# Patient Record
Sex: Female | Born: 1945 | Race: Black or African American | Hispanic: No | Smoking: Former smoker
Health system: Southern US, Community
[De-identification: ages and names within clinical notes are randomized; demographics above are authoritative.]

## PROBLEM LIST (undated history)

## (undated) DIAGNOSIS — I509 Heart failure, unspecified: Secondary | ICD-10-CM

## (undated) DIAGNOSIS — J449 Chronic obstructive pulmonary disease, unspecified: Secondary | ICD-10-CM

## (undated) HISTORY — PX: TRACHEOSTOMY: SUR1362

---

## 2001-07-27 ENCOUNTER — Other Ambulatory Visit: Admission: RE | Admit: 2001-07-27 | Discharge: 2001-07-27 | Payer: Self-pay | Admitting: Internal Medicine

## 2003-02-15 ENCOUNTER — Inpatient Hospital Stay (HOSPITAL_COMMUNITY): Admission: EM | Admit: 2003-02-15 | Discharge: 2003-02-19 | Payer: Self-pay | Admitting: Emergency Medicine

## 2003-02-15 ENCOUNTER — Encounter: Payer: Self-pay | Admitting: Emergency Medicine

## 2003-07-11 ENCOUNTER — Ambulatory Visit (HOSPITAL_COMMUNITY): Admission: RE | Admit: 2003-07-11 | Discharge: 2003-07-11 | Payer: Self-pay | Admitting: Family Medicine

## 2003-07-11 ENCOUNTER — Encounter: Payer: Self-pay | Admitting: Family Medicine

## 2004-08-31 ENCOUNTER — Inpatient Hospital Stay (HOSPITAL_COMMUNITY): Admission: AD | Admit: 2004-08-31 | Discharge: 2004-09-08 | Payer: Self-pay | Admitting: Family Medicine

## 2005-10-02 ENCOUNTER — Ambulatory Visit (HOSPITAL_COMMUNITY): Admission: RE | Admit: 2005-10-02 | Discharge: 2005-10-02 | Payer: Self-pay | Admitting: Family Medicine

## 2006-10-28 ENCOUNTER — Ambulatory Visit (HOSPITAL_COMMUNITY): Admission: RE | Admit: 2006-10-28 | Discharge: 2006-10-28 | Payer: Self-pay | Admitting: Family Medicine

## 2008-04-01 ENCOUNTER — Ambulatory Visit (HOSPITAL_COMMUNITY): Admission: RE | Admit: 2008-04-01 | Discharge: 2008-04-01 | Payer: Self-pay | Admitting: Family Medicine

## 2009-04-25 ENCOUNTER — Ambulatory Visit (HOSPITAL_COMMUNITY): Admission: RE | Admit: 2009-04-25 | Discharge: 2009-04-25 | Payer: Self-pay | Admitting: Family Medicine

## 2010-06-14 ENCOUNTER — Ambulatory Visit (HOSPITAL_COMMUNITY): Admission: RE | Admit: 2010-06-14 | Discharge: 2010-06-14 | Payer: Self-pay | Admitting: Family Medicine

## 2010-06-19 ENCOUNTER — Ambulatory Visit (HOSPITAL_COMMUNITY): Admission: RE | Admit: 2010-06-19 | Discharge: 2010-06-19 | Payer: Self-pay | Admitting: Family Medicine

## 2010-12-09 ENCOUNTER — Encounter: Payer: Self-pay | Admitting: Family Medicine

## 2011-04-05 NOTE — Discharge Summary (Signed)
Brandy [name], Brandy [name]                ACCOUNT NO.:  [id number]   MEDICAL RECORD NO.:  [id number]          PATIENT TYPE:  INP   LOCATION:  A209                          FACILITY:  [facility]   PHYSICIAN:  Annia Friendly. Loleta Chance, M.D.   DATE OF BIRTH:  [birth year]   DATE OF ADMISSION:  08/31/2004  DATE OF DISCHARGE:  10/22/2005LH                                 DISCHARGE SUMMARY   The patient was a 65 year old single, gravida 2, para 2, AB 0 African-  American female from [place], [place].  Chief complaints were  general malaise, decreased appetite, runny nose, and cough.  The patient had  been experiencing these symptoms off and on past 5 days.  Cough was  described as productive (white) for 2-3 days.  The patient also experienced  shortness of breath on exertion but not at rest.  She denied nausea,  vomiting, diarrhea, chills, earache, headache, and sore throat.  The patient  took a flu shot on August 20, 2004 at the local health department.  Medical  history is positive for chronic obstructive pulmonary disease,  hyperlipidemia, and hypertension.  The patient was not allergic to any  medication.   HABITS:  Positive for former cigarette smoking.   PAST MEDICAL HISTORY:  1.  Positive for hospitalization with pregnancy.  2.  Breathing problem March 2004.   FAMILY HISTORY:  Mother living, age 21, with history of hypertension; father  deceased, cause unknown; sister living at age 36, health unknown; three  brother living age 69 with history of hypertension.  One in his 65s, health  unknown and brother in his 28s with good health; two sons living, age 22 in  good health and age 6, in good health.   PROBLEMS:  1.  Exacerbation of chronic obstructive pulmonary disease secondary to acute      bilateral pneumonia.  General appearance revealed a middle-age, small-      framed, slightly short, black female who appeared not to feel well and      short of breath.  Vitals on admission were as  follows:  Temperature      100.5, pulse 129, respirations 20, blood pressure 105/65 in the office.      Eyes were negative at discharge.  Tympanic membranes were pearly gray.      Nose negative at discharge.  Posterior pharynx was benign.  Neck was      negative for adenopathy or thyromegaly.  Lungs demonstrated poor air      movement and auscultation.  Heart revealed audible S1 and S2 without      murmur, rate of 132 and regular.  Abdomen was slightly obese with      hypoactive bowel sounds.  Abdomen was soft and nontender in all four      quadrants.  Abdominal exam demonstrated no palpable masses or      organomegaly.  Extremities demonstrated no cyanosis, no edema, and      nonpalpable right dorsalis pedis.  Left dorsalis pedis was palpable.      The patient was alert and oriented to person, place, and  time.  Cranial      nerves 2-12 appeared intact.  Significant labs on admission were as      follows:  ABGs on 2 L of O2 on August 31, 2004 at 11:58 demonstrated      the following:  pH 7.47, pCO2 37.0, pO2 48.8, pO2 83.5; ABGs on 4 L O2      1710 on August 31, 2004 were as follows:  pH 7.46, pCO2 39.8, pO2 62.0,      O2 saturation 91.2%.  Other significant labs on admission were as      follows:  White count 20,200, hemoglobin 13.0, hematocrit 38.3,      platelets 297,000, sodium 135, potassium 3.8, chloride 94, CO2 29,      glucose 137, BUN 17, creatinine 1.1, calcium 9.4, total protein 7.6,      albumin 2.9, AST 26, ALT 20, ALP 65, total bilirubin 1.3, total CPK 45,      CK-MB 1.4, troponin I 0.04.  Chest x-ray on admission was read as a      right lower lobe atelectasis/infiltrative changes with mild      atelectasis/infiltrate within the left base; 11 mm size nodule within      the right lung base.  This was increased in size, had a band-like area      of increased density superimposed on the inferior hilum which appeared      more prominent; the right hilum itself appeared enlarged  inferiorly.      Radiology recommended a chest CT and further evaluation to exclude an      underlying mass.  The patient underwent a CT of the chest on August 31, 2004, at 2004.  CT was read as 14 x 8 mm right lower lobe nodule; patchy      bilateral lobe pneumonia; lateral segment of right middle lobe      atelectasis.  Question airway obstruction; advanced changes of COPD as      read by Dr. Elly Modena.  The patient was admitted to medical floor      with telemetry, IV fluids, Xopenex 1.25 mg/Atrovent neb treatment q.6h.,      oxygen at 4 Lpm via nasal cannula, Advair Diskus 100/50, 1 inhalation      q.12h., Zithromax IV calculated by pharmacy, Solu-Medrol IV tapering      dose, antitussive medication, Levaquin 500 mg IV daily, Flumadine 100 mg      p.o. b.i.d. x 7 days, Pepcid 20 mg p.o. q.h.s., blood cultures x 2      fifteen minutes apart and other support measures.  White count went up      initially during his hospitalization.  White count reached a high of      28,500 on September 06, 2004.  Last white count before discharge had      decreased to 22,500.  The patient felt clinically better.  ABGs on room      air on September 08, 2004, demonstrated the following:  pH 7.48, pCO2      38.6, pO2 66.5, O2 saturation 93.8%.  Chest x-ray during this      hospitalization was read as follows:  On September 02, 2004:  Mildly      improved bibasilar pneumonia; stable 1.2 cm right lower lung zone nodule      and stable mild changes of COPD as read by Dr. Viviann Spare __________.  Chest      x-ray on  September 06, 2004, read as no mildly improved bibasilar      infiltrate; COPD and underlying chronic changes with stable right lower      lung nodule as read by Dr. Ulyses Southward.  The patient was alert and      oriented to person, place, and time at time of discharge.  She was      sleeping without oxygen.  She denied shortness of breath or chest     discomfort at rest or walking in room or hall.   Appetite had improved.      O2 saturation on room air on September 08, 2004, was 94%.  She revealed      some air movement on auscultation much better than compared to      admission.  She was discharged to her home on September 08, 2004.      Moreover, her heart rated had decreased significantly compared to      admission.  Heart rate on the morning of discharge was in the low mid      90s.  Respiratory culture grew to be a normal oropharyngeal flora.      Blood cultures demonstrated no growth after 5 days.  The patient was      afebrile at time of discharge.  2.  Hyperlipidemia.  The patient was continued to be treated with Lescol 80      p.o. every day.  Moreover, she was on a low cholesterol diet.  She was      not complaining of muscle ache or arm pain or leg pain during this      hospitalization.  3.  Hypertension.  Blood pressure on admission was 105/65.  Her heart exam      revealed audible S1 and S2.  Her rate in the low 130s.  Her EKG on      admission was read as sinus tachycardia, biatrial enlargement, low      voltage QRS, PR depression in inferior lead as read by Dr. Wadsworth Bing.  Chest x-ray on October 14 was read as stable and normal size      heart.  The patient required no antihypertensive medications during this      hospitalization.  Cardiac enzymes were negative on admission.  Blood      pressure on the morning of discharge was 136/83.  Blood pressure will be      monitored closely as an outpatient and acted upon accordingly.  4.  Osteopenia.  Examination of thoracic spine demonstrated degenerative      changes, and there are a few osteopenia by chest x-ray.  The patient was      started on calcium plus D 1 tablet twice a day during this      hospitalization.  5.  Chronic anemia.  Hemoglobin on admission was 13.0, hematocrit 38.1.      Hemoglobin decreased to 9.9 on September 03, 2004.  Last hemoglobin was      10.4, hematocrit 30.0 on September 08, 2004.  The  patient was discharged      home on Hemocyte Plus 1 tablet p.o. every day.  She will be given a      Hemocyte card to check for occult blood.  Family history is negative for      colon cancer.  It is felt that anemia was probably iatrogenic.   At time of discharge, diet:  Low sodium, low cholesterol.   ACTIVITY:  As tolerated.   MEDICATIONS:  1.  Advair 100/50, 1 inhalation q.12h.  2.  Histussin AC 2 tsp q.4h. p.r.n. cough.  3.  Combivent metered dose inhaler 2 puffs q.4h.  4.  Lescol XL 80 mg 1 tab daily q.h.s.  5.  Xanax 0.25 mg p.o. b.i.d. p.r.n.  6.  Os-Cal plus D 1 tab b.i.d.  7.  Hemocyte Plus 1 tab daily.  8.  Prednisone dose-pack 5 mg x 6 days.  9.  Levaquin 500 mg 1 daily.   Follow up with Dr. Loleta Chance on October 26 or 27, 2005.   FINAL PRIMARY DIAGNOSIS:  Exacerbation of chronic obstructive pulmonary  disease secondary to bilateral pneumonia.   SECONDARY DIAGNOSIS:  1.  Hypertension.  2.  Anemia, probable iatrogenic. 3.  Osteopenia.  4.  Hyperlipidemia.  5.  Right lung base nodule.  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   ADDENDUM:  The patient will be referred to Dr. Mills Koller, thoracic  surgeon for further evaluation of right lung base nodule by chest x-ray and  CT as an outpatient.      GKH/MEDQ  D:  09/16/2004  T:  09/16/2004  Job:  [id number]

## 2011-04-05 NOTE — H&P (Signed)
Brandy [name], Brandy [name]                          ACCOUNT NO.:  [id number]   MEDICAL RECORD NO.:  [id number]                   PATIENT TYPE:  EMS   LOCATION:  ED                                   FACILITY:  [facility]   PHYSICIAN:  Gracelyn Nurse, M.D.              DATE OF BIRTH:  01/09/1946   DATE OF ADMISSION:  02/15/2003  DATE OF DISCHARGE:                                HISTORY & PHYSICAL   CHIEF COMPLAINT:  Shortness of breath.   HISTORY OF PRESENT ILLNESS:  This is a 65 year old black female who presents  with a three-week history of shortness of breath.  It has become  progressively worse, much worse over the last 24 hours.  She has been doing  some wheezing.  She had a cough productive of yellow-green sputum.  She has  had no fever or chills.  She saw her primary care M.D. and was given  antibiotics and cough syrup.  She has progressively gotten worse.  She does  have a history of smoking.  She stopped 10 years ago, but is around a lot of  secondhand smoke.   PAST MEDICAL HISTORY:  Hypertension.   PAST SURGICAL HISTORY:  No past surgeries.   ALLERGIES:  No known drug allergies.   MEDICATIONS:  Lisinopril daily.   SOCIAL HISTORY:  She stopped smoking 10 years ago.  She is exposed to  secondhand smoke at her work.  She drinks no alcohol.  She works as a  Counsellor.  She is divorced and has two children.   FAMILY HISTORY:  Her mother is in her 65s and has hypertension.  Her father  died when she was young of unknown cause.   REVIEW OF SYSTEMS:  As per HPI.  Remainder of systems negative.   PHYSICAL EXAMINATION:  VITAL SIGNS:  The pulse is 20, respirations 30, blood  pressure 158/114, and O2 saturations 88% on room air.  GENERAL APPEARANCE:  This is a poorly nourished white female who is in mild  respiratory distress.  HEENT:  Pupils equal, round, and reactive to light.  Extraocular movements  intact.  Oral mucosa moist.  Oropharynx clear.  CARDIOVASCULAR:  Tachy with no  murmurs.  LUNGS:  Very poor air movement.  Some bilateral expiratory wheezing.  ABDOMEN:  Soft, nontender, and nondistended.  Bowel sounds positive.  EXTREMITIES:  No edema.  NEUROLOGIC:  Cranial nerves grossly intact.  No focal deficit.  SKIN:  Moist with no rash.   LABORATORY DATA:  The chest x-ray shows no infiltrates and question of  perihilar mass, but this is only a portable.  Admitting labs with white  blood cell count 14.8, hemoglobin 12, and platelets 280.  Sodium 134,  potassium 3.8, chloride 100, CO2 27, BUN 16, creatinine 0.8, glucose 161.  The pH was 7.17, pCO2 73, pO2 252, and bicarbonate 28 on a 100%  nonrebreather.  After nebulizer,  the pH was 7.23, pCO2 64, pO2 284, and  bicarbonate 26.   ASSESSMENT AND PLAN:  1. Chronic obstructive pulmonary disease exacerbation.  The patient does not     carry a diagnosis of this, however, with her smoking history and her     exposure to secondhand smoke and her hyperinflated lungs on the chest x-     ray, she likely has this diagnosis.  Will go ahead and treat her with     frequent nebulizers, IV steroids, O2 support, and antibiotics.  2. Hypercapnic respiratory failure.  She has improved some after a nebulizer     treatment.  Will continue the treatment as above, however, will try to     use the lowest O2 possible to maintain her O2 saturations and lessen her     pO2 retainment.  3. Bronchitis.  This is likely what triggered this.  Will go ahead and start     her on some IV antibiotics.  4. Hypertension.  Will continue her lisinopril.  5. Hyperglycemia, probably stress.  This will probably get worse with IV     steroids, who will have to keep an eye on this and use sliding scale if     necessary.                                               Gracelyn Nurse, M.D.    JDJ/MEDQ  D:  02/15/2003  T:  02/15/2003  Job:  [id number]

## 2011-04-05 NOTE — H&P (Signed)
NAMESISSY, GOETZKE                ACCOUNT NO.:  [id number]   MEDICAL RECORD NO.:  [id number]          PATIENT TYPE:  INP   LOCATION:  A209                          FACILITY:  [facility]   PHYSICIAN:  Annia Friendly. Loleta Chance, M.D.   DATE OF BIRTH:  14-Aug-1946   DATE OF ADMISSION:  08/31/2004  DATE OF DISCHARGE:  LH                                HISTORY & PHYSICAL   The patient is a 65 year old single gravida 2, para 2, AB 0, __________  black female from [place], [place].   CHIEF COMPLAINT:  General malaise, decreased appetite, runny nose, and  cough. The patient has been experiencing symptoms off and on x5 days. Cough  is described as productive (white) for 2 to 3 days. The patient experienced  shortness of breath on exertion but not at rest. The patient denies nausea,  vomiting, diarrhea, chills, earache, headache, and sore throat. The patient  took a flu shot on September 16, 2004 at the local health department.   Medical history is positive for chronic obstructive pulmonary disease,  hyperlipidemia, and hypertension. Medical history is negative for diabetes,  tuberculosis, cancer, sickle cell, and seizure disorder.   MEDICATIONS:  Prescribed medications on admission are:  1.  Advair 100/50 one inhalation every 12 hours.  2.  Combivent metered dose inhaler 2 puffs every 4 to 6 hours p.r.n.  3.  Prednisone syrup 5 mg per 5 cc p.o. every day.  4.  Lisinopril HCT 20/25 p.o. every day.   ALLERGIES:  The patient is not allergic to any known medications.   HABITS:  Positive for former cigarette smoker. Habits are negative for  ethanol and street drug use.   PAST MEDICAL HISTORY:  Positive for hospitalization for pregnancy and  breathing problems in March of 2004.   FAMILY HISTORY:  Revealed mother living at age 84 with history of  hypertension; father deceased cause unknown; 1 sister living age 37 health  unknown; 3 brothers living, age 13 with a history of hypertension, 1 in his  15s health unknown, brother in his 99s in good health; 2 sons living age 58  in good health and age 58 in good health.   REVIEW OF SYSTEMS:  Positive for chronic shortness of breath, occasional  wheezing, variable appetite.   REVIEW OF SYSTEMS:  Negative for epistaxis, bleeding gums, chest pain,  hemoptysis, hematemesis, dysuria, gross hematuria, melena, constipation,  edema of legs, swelling of face, dysphagia, night sweats, etc.   PHYSICAL EXAMINATION:  GENERAL APPEARANCE:  Revealed a middle-aged, small-  framed, slightly short black female who appeared not to feel well, short of  breath at rest.  VITAL SIGNS:  Temperature 100.5, pulse 129, respirations 20, blood pressure  105/65.  HEENT:  Head normocephalic. Ears:  Normal auricle; external canal patent.  Tympanic membranes pearly gray. Eyes:  Clear; negative for ptosis, sclerae  white; pupils are equal, round, and reactive to light; extraocular movements  intact. Nose:  Negative at discharge. Mouth:  Partial upper dentures.  Dentition:  At bottom, positive missing teeth; no oral lesions; posterior  oropharynx benign.  NECK:  Negative for lymphadenopathy or thyromegaly.  LUNGS:  Poor air movement.  HEART:  Audible S1 and S2 without murmur. Rate 132 and regular.  BREASTS:  No skin changes. Nipple regular.  ABDOMEN:  Slightly obese, hypoactive bowel sounds, soft, nontender in all 4  quadrants, no palpable masses, no organomegaly.  EXTERNAL GENITALIA:  Normal female.  RECTAL:  Deferred.  EXTREMITIES:  No edema. No joint swelling, no joint redness, no joint  hotness, no cyanosis. Palpable left dorsalis pedis, nonpalpable right  dorsalis pedis.  NEUROLOGICAL:  Alert and oriented to person, place, and time. Cranial nerves  II-XII appeared intact.   LABORATORY DATA:  ABGs on 2 liters:  PH 7.47, pCO2 37.0, pO2 48.8, O2  saturation 83.5%. Repeat ABG at 1710 on 4 liters of O2:  PH 7.46, pCO2 39.8,  pO2 62.0, O2 saturation 91.2%. White  count 20.2, hemoglobin 13.0, hematocrit  38.3, platelets 297,000. Sodium 135, potassium 3.8, chloride 94, CO2 29,  glucose 137, BUN 17, creatinine 1.1. Total bilirubin 1.3, SGOT 26, SGPT 20,  total protein 7.6, albumin 2.9, calcium 9.4. Total CPK 45, CK-MB 1.4,  troponin I 0.04. Chest x-ray was read as right lower lobe atelectasis,  slight infiltrative changes with a mild atelectasis, slight infiltrate  within the left base; 11-mm sized nodule in the right base which was  increased in size and band-like area of increased density superimposed on  inferior right hilum which appeared more prominent. Right hilum is large and  purulent, and the radiology recommended chest CT and further evaluation to  exclude underlying mass.   IMPRESSION:  1.  Chronic obstructive pulmonary disease with hypoxia.  2.  Nodule within the right lung base.   SECONDARY DIAGNOSES:  1.  History of hypertension.  2.  Hyperlipidemia.   PLAN:  Admit to telemetry. IV normal saline at 50 cc per hour. CT of chest.  Advair Diskus 100/50 one inhalation q.12h. Zithromax calculated by pharmacy.  Lescol XL 80 mg p.o. daily.  Solu-Medrol 125 mg IV q.6h. for 24 hours, then  80 mg IV q.6h. x4, then 60 mg IV q.6h. x4, then 40 mg IV q.6h. x4. Blood  cultures x2. Robitussin DM 2 teaspoons p.o. q.4h. for 72 hours, then q.6h.  Bedside commode. Repeat ABGs in a.m. Diet low cholesterol. Head of bed  greater than 35 degrees. Tylenol 500 mg p.o. q.6-8h. for headache or pain or  temperature of 101 or greater. Pepcid 20 mg p.o. at bedtime. Coumadin 100 mg  p.o. b.i.d.      GKH/MEDQ  D:  08/31/2004  T:  08/31/2004  Job:  [id number]

## 2011-04-05 NOTE — Discharge Summary (Signed)
Brandy [name], Brandy [name]                          ACCOUNT NO.:  [id number]   MEDICAL RECORD NO.:  [id number]                   PATIENT TYPE:  INP   LOCATION:  A228                                 FACILITY:  [facility]   PHYSICIAN:  Gracelyn Nurse, M.D.              DATE OF BIRTH:  10-12-1946   DATE OF ADMISSION:  02/15/2003  DATE OF DISCHARGE:  02/19/2003                                 DISCHARGE SUMMARY   DISCHARGE DIAGNOSES:  1. Chronic obstructive pulmonary disease exacerbation.  2. Hypercapnic respiratory failure.  3. Bronchitis.  4. Hypertension.  5. Steroid induced hyperglycemia.   DISCHARGE MEDICATIONS:  1. Combivent meter dosed inhaler 2 puffs q.i.d.  2. Levaquin 500 mg daily x6 days.  3. Xanax 0.25 mg t.i.d. p.r.n.  4. Lisinopril 10 mg daily.  5. Prednisone 10 mg five tablets daily x3 days, then four tablets daily, x3     days, then two tablets daily x3 days, then one tablet daily x3 days.   REASON FOR ADMISSION:  This is a 65 year old white female who presents with  a three week history of shortness of breath and has become progressively  worse, and much worse over the past 24 hours.  She has been noting some  wheezing.  She had a productive cough with yellow-green sputum.  She has not  complained of any fever or chills.  She saw her primary M.D. and was given  antibiotics and cough syrup.  She seems to have progressively gotten worse.  She does have a history of tobacco abuse, but stopped 10 years ago, however,  in the work that she does she is exposed to a lot of second hand smoke.  She  has never been diagnosed with chronic obstructive pulmonary disease before  in the past.   HOSPITAL COURSE:  #1 -  CHRONIC OBSTRUCTIVE PULMONARY DISEASE EXACERBATION:  She has not carried a diagnosis of chronic obstructive pulmonary disease,  however, with her smoking history and large exposure to second hand smoke,  plus her hyperinflated lungs on chest x-ray, more then likely she does  have  chronic obstructive pulmonary disease.  She had very poor air movement on  examination and some mild expiratory wheezing.  She was also hypercapnic and  hypoxic.  She was started on O2 support, nebulizers, and IV steroids.  She  slowly responded to treatment.  She was maintained on lowest flow O2 to keep  her saturations above 90.  By the time of discharge, she was completely off  oxygen, saturating in the low to mid 90's on room air.  She also had been  converted from nebulizers to meter dosed inhaler, and was tolerating this  well.  She understood the use of them.  She was also taken off IV steroids  and placed on a p.o. taper.  She is to follow up with her new primary care  physician.  At that  time it can be reassessed if she still needs the  bronchodilator treatment.  #2 -  BRONCHITIS:  Her chest x-ray did not show any infiltrates.  She had  some peribronchial thickening, and with her productive cough felt this was  consistent with bronchitis.  She was started on Levaquin and will finish a  10 day course as an outpatient.  #3 -  HYPERCAPNIC RESPIRATORY FAILURE:  On admission her blood gas, she had  a pH of 7.17, PCO2 of 73, and a PO2 of 252 on 100% non-rebreather.  She was  given bronchodilator therapy and her O2 was lowered.  She responded pretty  quickly.  Blood gas redrawn in a couple of hours showed a pH of 7.23, PCO2  of 64.  She was maintained at above on lowest possible O2 during hospital  stay, and was actually weaned all the way off it.  She had no mental status  changes from the hypercapnia.  We the above numbers I doubt that she is a  chronic retainer, this was probably an acute event.  We did not redraw a  blood gas here in the hospital.  I recommend when she is at her baseline  that one be drawn, but at time of discharge respiratory failure had  resolved.  #3 -  STEROID INDUCED HYPERGLYCEMIA:  Her blood sugars remained in the 150  range while here in the hospital,  but she was on high dose steroids.  Recommend this be followed up on after she is completely off the steroids  for a period of time.  #4 -  HYPERTENSION:  She was maintained on Lisinopril and blood pressure was  adequately controlled.   LABORATORY DATA:  Sodium 134, potassium 4.3, chloride 100, CO2 27, BUN 16,  creatinine 0.7, glucose 148.   DISPOSITION:  The patient is discharged in stable condition.  She is  completely off oxygen.  She is to finish the steroid taper and a course of  antibiotics.   FOLLOWUP:  She wishes to change primary care physician's, and says she has  arranged to see Dr. Elliot Cousin in followup.  So I have advised her to  call Dr. Sherrie Mustache the first of the week for a follow up appointment in  approximately one week.                                               Gracelyn Nurse, M.D.    JDJ/MEDQ  D:  02/19/2003  T:  02/21/2003  Job:  [id number]   cc:   Elliot Cousin, M.D.  [address]  [place]  [place] [address]  Fax: [phone]

## 2011-04-05 NOTE — Procedures (Signed)
NAMEJOSEFINE, FUHR                ACCOUNT NO.:  [id number]   MEDICAL RECORD NO.:  [id number]           PATIENT TYPE:   LOCATION:                                 FACILITY:   PHYSICIAN:  Winchester Bing, M.D.  DATE OF BIRTH:  08-01-1946   DATE OF PROCEDURE:  DATE OF DISCHARGE:                                EKG INTERPRETATION   1.  Sinus tachycardia.  2.  Biatrial enlargement.  3.  Low voltage QRS.  4.  PR depression in the inferior leads.     [place]   RR/MEDQ  D:  09/04/2004  T:  09/04/2004  Job:  [id number]

## 2013-10-27 ENCOUNTER — Inpatient Hospital Stay (HOSPITAL_COMMUNITY)
Admission: AD | Admit: 2013-10-27 | Discharge: 2013-11-16 | DRG: 004 | Disposition: A | Discharge status: Other Acute Inpatient Hospital | Payer: Medicare Other | Source: Other Acute Inpatient Hospital | Attending: Pulmonary Disease | Admitting: Pulmonary Disease

## 2013-10-27 ENCOUNTER — Inpatient Hospital Stay (HOSPITAL_COMMUNITY): Payer: Medicare Other

## 2013-10-27 DIAGNOSIS — I509 Heart failure, unspecified: Secondary | ICD-10-CM | POA: Diagnosis present

## 2013-10-27 DIAGNOSIS — Z9911 Dependence on respirator [ventilator] status: Secondary | ICD-10-CM

## 2013-10-27 DIAGNOSIS — R64 Cachexia: Secondary | ICD-10-CM | POA: Diagnosis present

## 2013-10-27 DIAGNOSIS — J95812 Postprocedural air leak: Secondary | ICD-10-CM | POA: Diagnosis present

## 2013-10-27 DIAGNOSIS — E43 Unspecified severe protein-calorie malnutrition: Secondary | ICD-10-CM | POA: Insufficient documentation

## 2013-10-27 DIAGNOSIS — I959 Hypotension, unspecified: Secondary | ICD-10-CM | POA: Diagnosis not present

## 2013-10-27 DIAGNOSIS — D649 Anemia, unspecified: Secondary | ICD-10-CM | POA: Diagnosis present

## 2013-10-27 DIAGNOSIS — E876 Hypokalemia: Secondary | ICD-10-CM | POA: Diagnosis present

## 2013-10-27 DIAGNOSIS — G9340 Encephalopathy, unspecified: Secondary | ICD-10-CM

## 2013-10-27 DIAGNOSIS — J9383 Other pneumothorax: Secondary | ICD-10-CM

## 2013-10-27 DIAGNOSIS — J939 Pneumothorax, unspecified: Secondary | ICD-10-CM

## 2013-10-27 DIAGNOSIS — J95811 Postprocedural pneumothorax: Secondary | ICD-10-CM | POA: Diagnosis present

## 2013-10-27 DIAGNOSIS — Z9981 Dependence on supplemental oxygen: Secondary | ICD-10-CM

## 2013-10-27 DIAGNOSIS — J9620 Acute and chronic respiratory failure, unspecified whether with hypoxia or hypercapnia: Secondary | ICD-10-CM

## 2013-10-27 DIAGNOSIS — F1010 Alcohol abuse, uncomplicated: Secondary | ICD-10-CM | POA: Diagnosis present

## 2013-10-27 DIAGNOSIS — Z87891 Personal history of nicotine dependence: Secondary | ICD-10-CM

## 2013-10-27 DIAGNOSIS — E162 Hypoglycemia, unspecified: Secondary | ICD-10-CM | POA: Diagnosis not present

## 2013-10-27 DIAGNOSIS — J441 Chronic obstructive pulmonary disease with (acute) exacerbation: Principal | ICD-10-CM

## 2013-10-27 DIAGNOSIS — J860 Pyothorax with fistula: Secondary | ICD-10-CM

## 2013-10-27 DIAGNOSIS — R1310 Dysphagia, unspecified: Secondary | ICD-10-CM | POA: Diagnosis present

## 2013-10-27 DIAGNOSIS — Z930 Tracheostomy status: Secondary | ICD-10-CM

## 2013-10-27 DIAGNOSIS — J449 Chronic obstructive pulmonary disease, unspecified: Secondary | ICD-10-CM

## 2013-10-27 DIAGNOSIS — J9600 Acute respiratory failure, unspecified whether with hypoxia or hypercapnia: Secondary | ICD-10-CM

## 2013-10-27 HISTORY — DX: Chronic obstructive pulmonary disease, unspecified: J44.9

## 2013-10-27 HISTORY — DX: Heart failure, unspecified: I50.9

## 2013-10-27 LAB — COMPREHENSIVE METABOLIC PANEL
ALT: 28 U/L (ref 0–35)
Albumin: 2.2 g/dL — ABNORMAL LOW (ref 3.5–5.2)
Alkaline Phosphatase: 59 U/L (ref 39–117)
BUN: 12 mg/dL (ref 6–23)
Calcium: 7.7 mg/dL — ABNORMAL LOW (ref 8.4–10.5)
Chloride: 98 mEq/L (ref 96–112)
GFR calc Af Amer: 60 mL/min — ABNORMAL LOW (ref >90)
GFR calc non Af Amer: 52 mL/min — ABNORMAL LOW (ref >90)
Glucose, Bld: 87 mg/dL (ref 70–99)
Potassium: 2.9 mEq/L — ABNORMAL LOW (ref 3.5–5.1)
Sodium: 136 mEq/L (ref 135–145)
Total Bilirubin: 0.7 mg/dL (ref 0.3–1.2)
Total Protein: 4.3 g/dL — ABNORMAL LOW (ref 6.0–8.3)

## 2013-10-27 LAB — POCT I-STAT 3, ART BLOOD GAS (G3+)
Acid-Base Excess: 10 mmol/L — ABNORMAL HIGH (ref 0.0–2.0)
O2 Saturation: 100 %
Patient temperature: 97.8
pCO2 arterial: 27.4 mmHg — ABNORMAL LOW (ref 35.0–45.0)
pO2, Arterial: 140 mmHg — ABNORMAL HIGH (ref 80.0–100.0)

## 2013-10-27 LAB — PROTIME-INR: INR: 1.26 (ref 0.00–1.49)

## 2013-10-27 LAB — LACTIC ACID, PLASMA: Lactic Acid, Venous: 1.3 mmol/L (ref 0.5–2.2)

## 2013-10-27 LAB — APTT: aPTT: 31 seconds (ref 24–37)

## 2013-10-27 LAB — CBC WITH DIFFERENTIAL/PLATELET
Eosinophils Relative: 1 % (ref 0–5)
HCT: 24.7 % — ABNORMAL LOW (ref 36.0–46.0)
Hemoglobin: 8.2 g/dL — ABNORMAL LOW (ref 12.0–15.0)
Lymphocytes Relative: 9 % — ABNORMAL LOW (ref 12–46)
MCH: 26.5 pg (ref 26.0–34.0)
MCV: 79.7 fL (ref 78.0–100.0)
Monocytes Absolute: 1.2 10*3/uL — ABNORMAL HIGH (ref 0.1–1.0)
Monocytes Relative: 13 % — ABNORMAL HIGH (ref 3–12)
Neutro Abs: 6.8 10*3/uL (ref 1.7–7.7)
RBC: 3.1 MIL/uL — ABNORMAL LOW (ref 3.87–5.11)
WBC: 8.8 10*3/uL (ref 4.0–10.5)

## 2013-10-27 LAB — MRSA PCR SCREENING: MRSA by PCR: NEGATIVE

## 2013-10-27 LAB — TROPONIN I: Troponin I: <0.3 ng/mL (ref <0.30)

## 2013-10-27 LAB — MAGNESIUM: Magnesium: 1.3 mg/dL — ABNORMAL LOW (ref 1.5–2.5)

## 2013-10-27 MED ORDER — SODIUM CHLORIDE 0.9 % IV SOLN
INTRAVENOUS | Status: DC
  Administered 2013-10-27 – 2013-11-01 (×6): via INTRAVENOUS

## 2013-10-27 MED ORDER — PROPOFOL 10 MG/ML IV EMUL
5.0000 ug/kg/min | INTRAVENOUS | Status: DC
  Administered 2013-10-28: 15 ug/kg/min via INTRAVENOUS
  Administered 2013-10-29: 30 ug/kg/min via INTRAVENOUS
  Administered 2013-10-29: 5 ug/kg/min via INTRAVENOUS
  Administered 2013-10-29: 40 ug/kg/min via INTRAVENOUS
  Filled 2013-10-27 (×2): qty 100

## 2013-10-27 MED ORDER — MAGNESIUM SULFATE 40 MG/ML IJ SOLN
2.0000 g | Freq: Once | INTRAMUSCULAR | Status: AC
  Administered 2013-10-27: 2 g via INTRAVENOUS
  Filled 2013-10-27: qty 50

## 2013-10-27 MED ORDER — BIOTENE DRY MOUTH MT LIQD
1.0000 "application " | Freq: Four times a day (QID) | OROMUCOSAL | Status: DC
  Administered 2013-10-27 – 2013-11-16 (×76): 15 mL via OROMUCOSAL

## 2013-10-27 MED ORDER — HEPARIN SODIUM (PORCINE) 5000 UNIT/ML IJ SOLN
5000.0000 [IU] | Freq: Three times a day (TID) | INTRAMUSCULAR | Status: DC
  Administered 2013-10-27 – 2013-11-04 (×21): 5000 [IU] via SUBCUTANEOUS
  Filled 2013-10-27 (×27): qty 1

## 2013-10-27 MED ORDER — ALBUTEROL SULFATE HFA 108 (90 BASE) MCG/ACT IN AERS
6.0000 | INHALATION_SPRAY | RESPIRATORY_TRACT | Status: DC | PRN
  Filled 2013-10-27: qty 6.7

## 2013-10-27 MED ORDER — PROPOFOL 10 MG/ML IV EMUL
INTRAVENOUS | Status: AC
  Filled 2013-10-27: qty 100

## 2013-10-27 MED ORDER — PANTOPRAZOLE SODIUM 40 MG IV SOLR
40.0000 mg | INTRAVENOUS | Status: DC
  Administered 2013-10-27: 40 mg via INTRAVENOUS
  Filled 2013-10-27 (×2): qty 40

## 2013-10-27 MED ORDER — ALBUTEROL SULFATE HFA 108 (90 BASE) MCG/ACT IN AERS
6.0000 | INHALATION_SPRAY | RESPIRATORY_TRACT | Status: DC
  Administered 2013-10-27 – 2013-10-28 (×5): 6 via RESPIRATORY_TRACT
  Filled 2013-10-27: qty 6.7

## 2013-10-27 MED ORDER — PROPOFOL 10 MG/ML IV EMUL
5.0000 ug/kg/min | INTRAVENOUS | Status: DC
  Administered 2013-10-27: 10 ug/kg/min via INTRAVENOUS

## 2013-10-27 MED ORDER — SODIUM CHLORIDE 0.9 % IV BOLUS (SEPSIS)
1000.0000 mL | Freq: Once | INTRAVENOUS | Status: AC
  Administered 2013-10-27: 1000 mL via INTRAVENOUS

## 2013-10-27 MED ORDER — CHLORHEXIDINE GLUCONATE 0.12 % MT SOLN
15.0000 mL | Freq: Two times a day (BID) | OROMUCOSAL | Status: DC
  Administered 2013-10-27: 15 mL via OROMUCOSAL
  Filled 2013-10-27: qty 15

## 2013-10-27 MED ORDER — IPRATROPIUM BROMIDE HFA 17 MCG/ACT IN AERS
6.0000 | INHALATION_SPRAY | RESPIRATORY_TRACT | Status: DC
  Administered 2013-10-27 – 2013-10-28 (×5): 6 via RESPIRATORY_TRACT
  Filled 2013-10-27: qty 12.9

## 2013-10-27 MED ORDER — POTASSIUM CHLORIDE 10 MEQ/50ML IV SOLN
10.0000 meq | INTRAVENOUS | Status: AC
  Administered 2013-10-27 (×4): 10 meq via INTRAVENOUS
  Filled 2013-10-27 (×4): qty 50

## 2013-10-27 MED ORDER — POTASSIUM PHOSPHATE DIBASIC 3 MMOLE/ML IV SOLN
15.0000 mmol | Freq: Once | INTRAVENOUS | Status: AC
  Administered 2013-10-27: 15 mmol via INTRAVENOUS
  Filled 2013-10-27: qty 5

## 2013-10-27 NOTE — H&P (Signed)
PULMONARY  / CRITICAL CARE MEDICINE  Name: Brandy [name] MRN: [id number] DOB: Nov 09, 1946    ADMISSION DATE:  10/27/2013 CONSULTATION DATE:  10/27/2013  REFERRING MD :  Maryruth Bun ICU PRIMARY SERVICE:  PCCM  CHIEF COMPLAINT:  Acute respiratory failure  BRIEF PATIENT DESCRIPTION: 67 yo with past medical history of COPD and CHF transferred from [facility] after being admitted x 2 weeks for acute respiratory failure.  Extubated 12/08.  Re intubated 12/09.  Course was complicated by pneumothorax requiring chest tube placement.  SIGNIFICANT EVENTS / STUDIES:  11/30  Admitted to [facility] with acute respiratory failure, failed BiPAP, intubated 12/08  Extubated 12/09  Re intubated 12/09  Pneumothorax / R chest tube placed  LINES / TUBES: OETT 12/09 >>> OGT 12/09 >>> Foley ??? >>> R [place] CVL 12/09 >>> R chest tube 12/09 >>>  CULTURES:  ANTIBIOTICS:  The patient is encephalopathic and unable to provide history, which was obtained for available medical records.  HISTORY OF PRESENT ILLNESS:  67 yo with past medical history of COPD and CHF transferred from [facility] after being admitted x 2 weeks for acute respiratory failure.  Extubated 12/08.  Re intubated 12/09.  Course was complicated by pneumothorax requiring chest tube placement.  PAST MEDICAL HISTORY :  No past medical history on file. No past surgical history on file. Prior to Admission medications   Not on File   Not on File  FAMILY HISTORY:  No family history on file.  SOCIAL HISTORY:  has no tobacco, alcohol, and drug history on file.  REVIEW OF SYSTEMS:  Unable to provide.  INTERVAL HISTORY:  VITAL SIGNS: FiO2 (%):  [50 %] 50 % (12/10 1600) Weight:  [42 kg (92 lb 9.5 oz)] 42 kg (92 lb 9.5 oz) (12/10 1600) HEMODYNAMICS:   VENTILATOR SETTINGS: Vent Mode:  [-] PRVC FiO2 (%):  [50 %] 50 % Set Rate:  [16 bmp] 16 bmp Vt Set:  [420 mL] 420 mL PEEP:  [5 cmH20] 5 cmH20 Plateau Pressure:  [29 cmH20] 29  cmH20  INTAKE / OUTPUT: Intake/Output   None     PHYSICAL EXAMINATION: General:  Appears acutely ill, mechanically ventilated, synchronous Neuro:  Encephalopathic, nonfocal, cough / gag diminished HEENT:  PERRL, OETT / OGT Cardiovascular:  Tachycardic, regular Lungs:  Bilateral very diminished sir entry, scattered rhonchi, continues air leak via R chest tube Abdomen:  Soft, nontender, bowel sounds diminished Musculoskeletal:  Moves all extremities, no edema Skin:  Intact  LABS: CBC  Recent Labs Lab 10/27/13 1558  WBC 8.8  HGB 8.2*  HCT 24.7*  PLT 145*   Coag's  Recent Labs Lab 10/27/13 1558  APTT 31  INR 1.26   BMET  Recent Labs Lab 10/27/13 1558  NA 136  K 2.9*  CL 98  CO2 32  BUN 12  CREATININE 1.07  GLUCOSE 87   Electrolytes  Recent Labs Lab 10/27/13 1558  CALCIUM 7.7*  MG 1.3*  PHOS 1.3*   Sepsis Markers  Recent Labs Lab 10/27/13 1735  LATICACIDVEN 1.3   ABG  Recent Labs Lab 10/27/13 1809  PHART 7.665*  PCO2ART 27.4*  PO2ART 140.0*   Liver Enzymes  Recent Labs Lab 10/27/13 1558  AST 29  ALT 28  ALKPHOS 59  BILITOT 0.7  ALBUMIN 2.2*   Cardiac Enzymes  Recent Labs Lab 10/27/13 1558  TROPONINI <0.30   Glucose  Recent Labs Lab 10/27/13 1602  GLUCAP 84   CXR:  12/10  Hardware in good position, overinflation,  no overt airspace disease  ASSESSMENT / PLAN:  PULMONARY A:  Acute respiratory failure. COPD. R pneumothorax s/p chest tube placement.  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 P:   Gaol SpO2>92, pH>7.30 Full mechanical support Suspect needs early trach Daily SBT Ventilator bundle Trend ABG / CXR Albuterol / Atrovent  CARDIOVASCULAR A: Hemodynamically stable. Suspicion of pulmonary edema on admission. P:  Goal MAP>60 Check troponin / lactate TTE  RENAL A:  Hypokalemia. Hypomagnesemia.  Hypophosphatemia. P:   CVP Trend BMP K 10 x 4 Mg 2 x 1 KPhos 15 x 1 NS 1000 x  1 Ns@100   GASTROINTESTINAL A:  Severe protein calorie malnutrition. P:   NPO as intubated TP per Nutritionist Protonix for GI Px  Likely to need PEG  HEMATOLOGIC A:  Chronic anemia. P:  Trend CBC Heparin for DVT Px  INFECTIOUS A:  No evidence of acute infection. P:   Monitor off abx  ENDOCRINE  A:  No active issues. P:   No intervention required  NEUROLOGIC A:  Acute encephalopathy.  History of alcohol abuse. P:   Goal RASS 0 to -1 Daily WUA Propofol gtt  I have personally obtained history, examined patient, evaluated and interpreted laboratory and imaging results, reviewed medical records, formulated assessment / plan and placed orders.  CRITICAL CARE:  The patient is critically ill with multiple organ systems failure and requires high complexity decision making for assessment and support, frequent evaluation and titration of therapies, application of advanced monitoring technologies and extensive interpretation of multiple databases. Critical Care Time devoted to patient care services described in this note is 45 minutes.   Lonia Farber, MD Pulmonary and Critical Care Medicine [facility] Pager: [phone]  10/27/2013, 7:01 PM

## 2013-10-27 NOTE — Progress Notes (Signed)
Panic ABG results given to RN Manon Hilding d/t RT called away needed for 2 new vent admits to ICU.  RN was waiting for MD orders.

## 2013-10-27 NOTE — Significant Event (Signed)
Hypokalemic.  Will give IV KCL.  Coralyn Helling, MD 10/27/2013, 6:58 PM

## 2013-10-27 NOTE — Significant Event (Signed)
Pt hypotensive after getting sedation.  CVP 5.  Will give fluid bolus.  Coralyn Helling, MD 10/27/2013, 6:19 PM

## 2013-10-28 ENCOUNTER — Inpatient Hospital Stay (HOSPITAL_COMMUNITY): Payer: Medicare Other

## 2013-10-28 ENCOUNTER — Encounter (HOSPITAL_COMMUNITY): Payer: Self-pay | Admitting: *Deleted

## 2013-10-28 DIAGNOSIS — J441 Chronic obstructive pulmonary disease with (acute) exacerbation: Principal | ICD-10-CM

## 2013-10-28 DIAGNOSIS — E43 Unspecified severe protein-calorie malnutrition: Secondary | ICD-10-CM | POA: Insufficient documentation

## 2013-10-28 LAB — LACTIC ACID, PLASMA
Lactic Acid, Venous: 1 mmol/L (ref 0.5–2.2)
Lactic Acid, Venous: 1.2 mmol/L (ref 0.5–2.2)

## 2013-10-28 LAB — CBC
HCT: 24 % — ABNORMAL LOW (ref 36.0–46.0)
Hemoglobin: 8.1 g/dL — ABNORMAL LOW (ref 12.0–15.0)
MCH: 26.7 pg (ref 26.0–34.0)
MCHC: 33.8 g/dL (ref 30.0–36.0)
MCV: 79.2 fL (ref 78.0–100.0)
RDW: 15.8 % — ABNORMAL HIGH (ref 11.5–15.5)

## 2013-10-28 LAB — POCT I-STAT 3, ART BLOOD GAS (G3+)
Acid-Base Excess: 2 mmol/L (ref 0.0–2.0)
O2 Saturation: 100 %
Patient temperature: 98.8
TCO2: 27 mmol/L (ref 0–100)
pCO2 arterial: 38 mmHg (ref 35.0–45.0)
pO2, Arterial: 191 mmHg — ABNORMAL HIGH (ref 80.0–100.0)

## 2013-10-28 LAB — BASIC METABOLIC PANEL
BUN: 13 mg/dL (ref 6–23)
Calcium: 7.4 mg/dL — ABNORMAL LOW (ref 8.4–10.5)
Creatinine, Ser: 1.18 mg/dL — ABNORMAL HIGH (ref 0.50–1.10)
GFR calc non Af Amer: 46 mL/min — ABNORMAL LOW (ref >90)
Glucose, Bld: 83 mg/dL (ref 70–99)
Potassium: 3.5 mEq/L (ref 3.5–5.1)

## 2013-10-28 LAB — GLUCOSE, CAPILLARY: Glucose-Capillary: 82 mg/dL (ref 70–99)

## 2013-10-28 LAB — TROPONIN I
Troponin I: <0.3 ng/mL (ref <0.30)
Troponin I: <0.3 ng/mL (ref <0.30)

## 2013-10-28 MED ORDER — FAMOTIDINE 40 MG/5ML PO SUSR
20.0000 mg | Freq: Every day | ORAL | Status: DC
Start: 2013-10-28 — End: 2013-11-16
  Administered 2013-10-28 – 2013-11-16 (×16): 20 mg
  Filled 2013-10-28 (×23): qty 2.5

## 2013-10-28 MED ORDER — BUDESONIDE 0.25 MG/2ML IN SUSP
0.2500 mg | Freq: Four times a day (QID) | RESPIRATORY_TRACT | Status: DC
  Administered 2013-10-28 – 2013-11-12 (×57): 0.25 mg via RESPIRATORY_TRACT
  Filled 2013-10-28 (×64): qty 2

## 2013-10-28 MED ORDER — POTASSIUM CHLORIDE 10 MEQ/50ML IV SOLN
10.0000 meq | INTRAVENOUS | Status: AC
  Administered 2013-10-28 (×2): 10 meq via INTRAVENOUS
  Filled 2013-10-28 (×2): qty 50

## 2013-10-28 MED ORDER — ALBUTEROL SULFATE (5 MG/ML) 0.5% IN NEBU
2.5000 mg | INHALATION_SOLUTION | Freq: Four times a day (QID) | RESPIRATORY_TRACT | Status: DC
  Administered 2013-10-28 – 2013-10-31 (×12): 2.5 mg via RESPIRATORY_TRACT
  Filled 2013-10-28 (×12): qty 0.5

## 2013-10-28 MED ORDER — FENTANYL CITRATE 0.05 MG/ML IJ SOLN
25.0000 ug | INTRAMUSCULAR | Status: DC | PRN
  Administered 2013-10-28 (×3): 50 ug via INTRAVENOUS
  Administered 2013-10-29: 100 ug via INTRAVENOUS
  Administered 2013-10-29: 50 ug via INTRAVENOUS
  Administered 2013-10-29: 100 ug via INTRAVENOUS
  Filled 2013-10-28 (×6): qty 2

## 2013-10-28 MED ORDER — IPRATROPIUM BROMIDE 0.02 % IN SOLN
0.5000 mg | Freq: Four times a day (QID) | RESPIRATORY_TRACT | Status: DC
  Administered 2013-10-28 – 2013-10-31 (×12): 0.5 mg via RESPIRATORY_TRACT
  Filled 2013-10-28 (×12): qty 2.5

## 2013-10-28 MED ORDER — VITAL AF 1.2 CAL PO LIQD
1000.0000 mL | ORAL | Status: DC
  Administered 2013-10-28: 1000 mL
  Filled 2013-10-28 (×3): qty 1000

## 2013-10-28 MED ORDER — ALBUTEROL SULFATE (5 MG/ML) 0.5% IN NEBU
2.5000 mg | INHALATION_SOLUTION | RESPIRATORY_TRACT | Status: DC | PRN
  Administered 2013-10-29: 2.5 mg via RESPIRATORY_TRACT
  Filled 2013-10-28: qty 0.5

## 2013-10-28 MED ORDER — VITAL AF 1.2 CAL PO LIQD
1000.0000 mL | ORAL | Status: DC
  Filled 2013-10-28 (×2): qty 1000

## 2013-10-28 MED ORDER — CHLORHEXIDINE GLUCONATE 0.12 % MT SOLN
15.0000 mL | Freq: Two times a day (BID) | OROMUCOSAL | Status: DC
  Administered 2013-10-28 – 2013-11-16 (×38): 15 mL via OROMUCOSAL
  Filled 2013-10-28 (×38): qty 15

## 2013-10-28 MED ORDER — PNEUMOCOCCAL VAC POLYVALENT 25 MCG/0.5ML IJ INJ
0.5000 mL | INJECTION | INTRAMUSCULAR | Status: AC
  Administered 2013-10-29: 0.5 mL via INTRAMUSCULAR
  Filled 2013-10-28: qty 0.5

## 2013-10-28 NOTE — Progress Notes (Signed)
PULMONARY  / CRITICAL CARE MEDICINE  Name: Brandy [name] MRN: [id number] DOB: Feb 05, 1946    ADMISSION DATE:  10/27/2013 CONSULTATION DATE:  10/27/2013  REFERRING MD :  Maryruth Bun ICU PRIMARY SERVICE:  PCCM  CHIEF COMPLAINT:  Acute respiratory failure  BRIEF PATIENT DESCRIPTION: 67 yo with past medical history of COPD and CHF transferred from [facility] after being admitted x 2 weeks for acute respiratory failure.  Extubated 12/08.  Re intubated 12/09.  Course was complicated by pneumothorax requiring chest tube placement.  SIGNIFICANT EVENTS / STUDIES:  11/30  Admitted to [facility] with acute respiratory failure, failed BiPAP, intubated 12/08  Extubated 12/09  Re intubated 12/09  Pneumothorax / R chest tube placed 12/10 Transferred to [facility]  LINES / TUBES: ETT 12/09 >>  R [place] CVL 12/09 >> R chest tube 12/09 >>  CULTURES:  ANTIBIOTICS:   INTERVAL HISTORY: RASS 0. + F/C. Tolerates PS 10 cm H2O  VITAL SIGNS: Temp:  [98 F (36.7 C)-98.9 F (37.2 C)] 98.9 F (37.2 C) (12/11 1218) Pulse Rate:  [62-114] 76 (12/11 1549) Resp:  [10-26] 12 (12/11 1600) BP: (56-137)/(35-90) 89/58 mmHg (12/11 1600) SpO2:  [91 %-100 %] 100 % (12/11 1600) FiO2 (%):  [30 %-50 %] 30 % (12/11 1600) Weight:  [38.3 kg (84 lb 7 oz)] 38.3 kg (84 lb 7 oz) (12/11 0200) HEMODYNAMICS: CVP:  [6 mmHg-11 mmHg] 11 mmHg VENTILATOR SETTINGS: Vent Mode:  [-] PRVC FiO2 (%):  [30 %-50 %] 30 % Set Rate:  [12 bmp-16 bmp] 12 bmp Vt Set:  [400 mL-420 mL] 400 mL PEEP:  [5 cmH20] 5 cmH20 Pressure Support:  [10 cmH20] 10 cmH20 Plateau Pressure:  [14 cmH20-42 cmH20] 14 cmH20  INTAKE / OUTPUT: Intake/Output     12/10 0701 - 12/11 0700 12/11 0701 - 12/12 0700   I.V. (mL/kg) 948.1 (24.8) 934.2 (24.4)   IV Piggyback 1508 50   Total Intake(mL/kg) 2456.1 (64.1) 984.2 (25.7)   Urine (mL/kg/hr) 1015 950 (2.7)   Chest Tube 200    Total Output 1215 950   Net +1241.1 +34.2          PHYSICAL EXAMINATION: General:   Appears acutely ill, mechanically ventilated, synchronous Neuro:  Encephalopathic, nonfocal, cough / gag diminished HEENT:  PERRL, OETT / OGT Cardiovascular:  Tachycardic, regular Lungs:  Bilateral very diminished sir entry, scattered rhonchi, continues air leak via R chest tube Abdomen:  Soft, nontender, bowel sounds diminished Musculoskeletal:  Moves all extremities, no edema Skin:  Intact  LABS:  I have reviewed all of today's lab results. Relevant abnormalities are discussed in the A/P section   CXR: NSC  ASSESSMENT / PLAN:  PULMONARY A:  Acute on chronic respiratory failure.  AECOPD.  R PTX/BPF P:   Cont vent support Cont vent bundle Cont BDs and nebulized steroids Cont chest tube to suction Daily SBT Might need trach tube  CARDIOVASCULAR A: No acute issues P:  Monitor  RENAL A:  Hypokalemia, resolved Hypomagnesemia Hypophosphatemia P:   I have reviewed all of today's lab results. Relevant abnormalities are discussed in the A/P section   GASTROINTESTINAL A:  Severe protein calorie malnutrition Risk of refeeding syndrome P:   SUP: famotidine TFs initiated 12/11  HEMATOLOGIC A:  Anemia without acute blood loss P:  DVT px: sq heparin Monitor CBC  INFECTIOUS A:  No evidence of acute infection P:   Micro and abx as above  ENDOCRINE  A:  No active issues. P:   Monitor glu on  chem panels Begin SSI for glu > 180  NEUROLOGIC A:  Acute encephalopathy History of alcohol abuse P:   Goal RASS 0 to -1 Daily WUA Propofol gtt  I have personally obtained history, examined patient, evaluated and interpreted laboratory and imaging results, reviewed medical records, formulated assessment / plan and placed orders.  CRITICAL CARE:  The patient is critically ill with multiple organ systems failure and requires high complexity decision making for assessment and support, frequent evaluation and titration of therapies, application of advanced monitoring  technologies and extensive interpretation of multiple databases. Critical Care Time devoted to patient care services described in this note is 35 minutes.   Billy Fischer, MD Pulmonary and Critical Care Medicine [facility] Pager: [phone]  10/28/2013, 4:20 PM

## 2013-10-28 NOTE — Progress Notes (Signed)
INITIAL NUTRITION ASSESSMENT  DOCUMENTATION CODES Per approved criteria  -Severe malnutrition in the context of chronic illness   INTERVENTION:  Initiate TF via OGT with Vital AF 1.2 at 15 ml/h, increase by 10 ml every 12 hours to goal rate of 35 ml/h to provide 1008 kcals, 63 gm protein, 681 ml free water daily. Monitor magnesium, potassium, and phosphorus daily for at least 3 days, MD to replete as needed, as pt is at risk for refeeding syndrome given severe protein calorie malnutrition with recent NPO status x 8 days at [facility].  NUTRITION DIAGNOSIS: Inadequate oral related to inability to eat as evidenced by NPO status.   Goal: Intake to meet >90% of estimated nutrition needs.  Monitor:  TF tolerance/adequacy, weight trend, labs, vent status.  Reason for Assessment: MD Consult for TF initiation and management.  67 y.o. female  Admitting Dx: Acute respiratory failure  ASSESSMENT: 67 yo with past medical history of COPD and CHF transferred from [facility] on 12/10 after being admitted x 2 weeks for acute respiratory failure. Intubated on 11/30, extubated on 12/08. Re intubated 12/09. Course was complicated by pneumothorax requiring chest tube placement.  Discussed patient in ICU rounds today; plans to start TF since unable to extubate today. Patient had been started on TF (Pulmocare at 25 ml/h) on 12/9 at [facility] prior to transfer to [facility]. She did not received enteral nutrition from 11/30 to 12/8. Patient is at risk for refeeding syndrome, given severe protein calorie malnutrition with recent NPO status x 8 days at [facility]. Potassium, magnesium, and phosphorus were all low on admission 12/10.  Patient is currently intubated on ventilator support.  MV: 6.6 L/min Temp (24hrs), Avg:98.6 F (37 C), Min:98 F (36.7 C), Max:98.9 F (37.2 C)  Propofol: 3.8 ml/hr providing 100 kcals/day.  Nutrition Focused Physical Exam:  Subcutaneous Fat:  Orbital  Region: mild-moderate depletion Upper Arm Region: severe depletion Thoracic and Lumbar Region: NA  Muscle:  Temple Region: severe depletion Clavicle Bone Region: severe depletion Clavicle and Acromion Bone Region: severe depletion Scapular Bone Region: NA Dorsal Hand: NA Patellar Region: severe depletion Anterior Thigh Region: severe depletion Posterior Calf Region: severe depletion  Edema: none  Pt meets criteria for severe MALNUTRITION in the context of chronic illness as evidenced by severe depletion of subcutaneous fat mass and muscle mass.  Height: Ht Readings from Last 1 Encounters:  10/27/13 5\' 1"  (1.549 m)    Weight: Wt Readings from Last 1 Encounters:  10/28/13 84 lb 7 oz (38.3 kg)    Ideal Body Weight: 47.7 kg  % Ideal Body Weight: 80%  Wt Readings from Last 10 Encounters:  10/28/13 84 lb 7 oz (38.3 kg)    Usual Body Weight: unknown  BMI:  Body mass index is 15.96 kg/(m^2). Underweight  Estimated Nutritional Needs: Kcal: 1050 Protein: 55-65 gm Fluid: 1.1-1.2 L  Skin: no wounds  Diet Order: NPO  EDUCATION NEEDS: -Education not appropriate at this time   Intake/Output Summary (Last 24 hours) at 10/28/13 1302 Last data filed at 10/28/13 1000  Gross per 24 hour  Intake 2817.51 ml  Output   1490 ml  Net 1327.51 ml    Last BM: none documented   Labs:   Recent Labs Lab 10/27/13 1558 10/28/13 0432  NA 136 138  K 2.9* 3.5  CL 98 102  CO2 32 26  BUN 12 13  CREATININE 1.07 1.18*  CALCIUM 7.7* 7.4*  MG 1.3*  --   PHOS 1.3*  --  GLUCOSE 87 83    CBG (last 3)   Recent Labs  10/27/13 1602 10/28/13 1233  GLUCAP 84 82    Scheduled Meds: . albuterol  2.5 mg Nebulization Q6H  . antiseptic oral rinse  1 application Mouth Rinse QID  . budesonide  0.25 mg Nebulization Q6H  . chlorhexidine  15 mL Mouth/Throat BID  . famotidine  20 mg Per Tube Daily  . feeding supplement (VITAL AF 1.2 CAL)  1,000 mL Per Tube Q24H  . heparin  subcutaneous  5,000 Units Subcutaneous Q8H  . ipratropium  0.5 mg Nebulization Q6H  . [START ON 10/29/2013] pneumococcal 23 valent vaccine  0.5 mL Intramuscular Tomorrow-1000    Continuous Infusions: . sodium chloride 100 mL/hr at 10/27/13 2052  . propofol 15 mcg/kg/min (10/28/13 1210)    Past Medical History  Diagnosis Date  . COPD (chronic obstructive pulmonary disease)   . CHF (congestive heart failure)     History reviewed. No pertinent past surgical history.   Joaquin Courts, RD, LDN, CNSC Pager [phone] After Hours Pager [phone]

## 2013-10-28 NOTE — Plan of Care (Signed)
Problem: Phase I Progression Outcomes Goal: Voiding-avoid urinary catheter unless indicated Outcome: Not Met (add Reason) Pt transferred from [facility] with foley

## 2013-10-28 NOTE — Progress Notes (Signed)
Echocardiogram 2D Echocardiogram has been performed.  Dorothey Baseman 10/28/2013, 11:50 AM

## 2013-10-29 ENCOUNTER — Encounter (HOSPITAL_COMMUNITY): Payer: Self-pay | Admitting: *Deleted

## 2013-10-29 ENCOUNTER — Inpatient Hospital Stay (HOSPITAL_COMMUNITY): Payer: Medicare Other

## 2013-10-29 DIAGNOSIS — J9600 Acute respiratory failure, unspecified whether with hypoxia or hypercapnia: Secondary | ICD-10-CM

## 2013-10-29 DIAGNOSIS — E43 Unspecified severe protein-calorie malnutrition: Secondary | ICD-10-CM

## 2013-10-29 LAB — CBC
HCT: 23.8 % — ABNORMAL LOW (ref 36.0–46.0)
Hemoglobin: 7.7 g/dL — ABNORMAL LOW (ref 12.0–15.0)
MCH: 26.2 pg (ref 26.0–34.0)
MCHC: 32.4 g/dL (ref 30.0–36.0)
Platelets: 166 10*3/uL (ref 150–400)
RBC: 2.94 MIL/uL — ABNORMAL LOW (ref 3.87–5.11)

## 2013-10-29 LAB — BASIC METABOLIC PANEL
BUN: 13 mg/dL (ref 6–23)
CO2: 25 mEq/L (ref 19–32)
Calcium: 7.8 mg/dL — ABNORMAL LOW (ref 8.4–10.5)
Chloride: 105 mEq/L (ref 96–112)
Creatinine, Ser: 1.19 mg/dL — ABNORMAL HIGH (ref 0.50–1.10)
GFR calc Af Amer: 53 mL/min — ABNORMAL LOW (ref >90)
GFR calc non Af Amer: 46 mL/min — ABNORMAL LOW (ref >90)
Glucose, Bld: 101 mg/dL — ABNORMAL HIGH (ref 70–99)
Potassium: 3.6 mEq/L (ref 3.5–5.1)
Sodium: 139 mEq/L (ref 135–145)

## 2013-10-29 LAB — GLUCOSE, CAPILLARY
Glucose-Capillary: 108 mg/dL — ABNORMAL HIGH (ref 70–99)
Glucose-Capillary: 83 mg/dL (ref 70–99)
Glucose-Capillary: 91 mg/dL (ref 70–99)
Glucose-Capillary: 94 mg/dL (ref 70–99)

## 2013-10-29 LAB — BLOOD GAS, ARTERIAL
Acid-base deficit: 0.4 mmol/L (ref 0.0–2.0)
Bicarbonate: 23.1 mEq/L (ref 20.0–24.0)
Drawn by: 252031
MECHVT: 400 mL
PEEP: 5 cmH2O
Patient temperature: 98.6
pCO2 arterial: 33.8 mmHg — ABNORMAL LOW (ref 35.0–45.0)
pH, Arterial: 7.45 (ref 7.350–7.450)

## 2013-10-29 LAB — TRIGLYCERIDES: Triglycerides: 98 mg/dL (ref <150)

## 2013-10-29 LAB — PHOSPHORUS: Phosphorus: 2.4 mg/dL (ref 2.3–4.6)

## 2013-10-29 LAB — MAGNESIUM: Magnesium: 1.7 mg/dL (ref 1.5–2.5)

## 2013-10-29 MED ORDER — FENTANYL CITRATE 0.05 MG/ML IJ SOLN
INTRAMUSCULAR | Status: AC
  Administered 2013-10-29: 25 ug via INTRAVENOUS
  Filled 2013-10-29: qty 2

## 2013-10-29 MED ORDER — SUCCINYLCHOLINE CHLORIDE 20 MG/ML IJ SOLN
40.0000 mg | Freq: Once | INTRAMUSCULAR | Status: AC
  Administered 2013-10-29: 40 mg via INTRAVENOUS

## 2013-10-29 MED ORDER — PROPOFOL 10 MG/ML IV BOLUS
INTRAVENOUS | Status: AC
  Administered 2013-10-29: 40 mg
  Filled 2013-10-29: qty 20

## 2013-10-29 MED ORDER — SODIUM CHLORIDE 0.9 % IV SOLN
INTRAVENOUS | Status: AC
  Administered 2013-10-29: via INTRAVENOUS

## 2013-10-29 MED ORDER — PROPOFOL 10 MG/ML IV EMUL
0.0000 ug/kg/min | INTRAVENOUS | Status: DC
  Administered 2013-10-29: 20 ug/kg/min via INTRAVENOUS
  Administered 2013-10-30: 15 ug/kg/min via INTRAVENOUS
  Administered 2013-10-31 (×2): 20 ug/kg/min via INTRAVENOUS
  Filled 2013-10-29 (×5): qty 100

## 2013-10-29 MED ORDER — VITAL AF 1.2 CAL PO LIQD
1000.0000 mL | ORAL | Status: DC
  Administered 2013-10-30 – 2013-11-15 (×13): 1000 mL
  Filled 2013-10-29 (×19): qty 1000

## 2013-10-29 MED ORDER — DEXTROSE 50 % IV SOLN: 50.0000 mL | Freq: Once | INTRAVENOUS | Status: AC | PRN

## 2013-10-29 MED ORDER — FENTANYL CITRATE 0.05 MG/ML IJ SOLN
12.5000 ug | INTRAMUSCULAR | Status: DC | PRN
  Administered 2013-10-29: 50 ug via INTRAVENOUS
  Administered 2013-10-29 – 2013-10-30 (×2): 25 ug via INTRAVENOUS
  Administered 2013-10-30: 12.5 ug via INTRAVENOUS
  Administered 2013-11-01: 25 ug via INTRAVENOUS
  Filled 2013-10-29 (×2): qty 2

## 2013-10-29 MED ORDER — SODIUM CHLORIDE 0.9 % IV SOLN
INTRAVENOUS | Status: AC
  Administered 2013-10-29: 23:00:00 via INTRAVENOUS

## 2013-10-29 NOTE — Progress Notes (Signed)
Attempted pt on wean. Increased Pressure support and multiple attempts to keep pt awake. Pt placed back on full support and RT will continue to monitor.

## 2013-10-29 NOTE — Progress Notes (Signed)
Pt taken off bipap and given a break. Pt placed on 50% venturi mask.

## 2013-10-29 NOTE — Procedures (Signed)
Extubation Procedure Note  Patient Details:   Name: Brandy [name] DOB: 1946-03-22 MRN: [id number]   Airway Documentation:     Evaluation  O2 sats: stable throughout Complications: No apparent complications Patient did tolerate procedure well. Bilateral Breath Sounds: Rhonchi Suctioning: Airway No- would not speak at all for RT.  Placed on 4L [place] and tolerating well.  Devra Dopp D 10/29/2013, 12:12 PM

## 2013-10-29 NOTE — Progress Notes (Signed)
Respiratory Therapist at bedside, following breathing treatment. Patient in trypod position, HR 145 RR 38 oxygen sats 87% B/P 96/92. Patient shaking and pulling oxygen off. Dr. Sung Amabile in to see. Dr. Marin Shutter made aware. In to assess. Orders obtained to place Bi-Pap. Bipap placed per RT. Will continue to monitor.

## 2013-10-29 NOTE — Procedures (Signed)
Intubation Procedure Note Brandy [name] Labell [id number] 03-20-1946  Procedure: Intubation Indications: Respiratory insufficiency  Procedure Details Consent: Unable to obtain consent because of emergent medical necessity. Time Out: Verified patient identification, verified procedure, site/side was marked, verified correct patient position, special equipment/implants available, medications/allergies/relevent history reviewed, required imaging and test results available.  Performed  Medications used: none and succinylcholine Equipment used: Macintosh 3 laryngoscope blade Grade I View Correct placement confirmed by by auscultation, by CXR and ETCO2 monitor Tube secured at 22 cm at the lip  Evaluation Hemodynamic Status: BP stable throughout; O2 sats: stable throughout Patient's Current Condition: stable Complications: No apparent complications Patient did tolerate procedure well. Chest X-ray ordered to verify placement.  CXR: pending.   Brandy [name], M.D. Pulmonary and Critical Care Medicine Call E-link with questions [phone] 10/29/2013

## 2013-10-29 NOTE — Progress Notes (Signed)
NUTRITION FOLLOW UP  DOCUMENTATION CODES  Per approved criteria   -Severe malnutrition in the context of chronic illness    Intervention:    If unable to extubate patient today, continue slow advancement of TF; increase by 10 ml every 12 hours to goal rate of 35 ml/h to provide 1008 kcals, 63 gm protein, 681 ml free water daily.  Monitor magnesium, potassium, and phosphorus daily for at 2 more days, MD to replete as needed, as pt is at risk for refeeding syndrome given severe PCM.  Nutrition Dx:   Inadequate oral intake related to inability to eat as evidenced by NPO status. Ongoing.  Goal:   Intake to meet >90% of estimated nutrition needs.  Monitor:   TF tolerance/adequacy, weight trend, labs, vent status.  Assessment:   67 yo with past medical history of COPD and CHF transferred from [facility] on 12/10 after being admitted x 2 weeks for acute respiratory failure. Intubated on 11/30, extubated on 12/08. Re intubated 12/09. Course was complicated by pneumothorax requiring chest tube placement.  Patient is at risk for refeeding syndrome, given severe protein calorie malnutrition with recent NPO status x 8 days at [facility]. Potassium, magnesium, and phosphorus were all low on admission 12/10. Potassium, magnesium, and phosphorus WNL today. TF of Vital AF 1.2 is currently on hold for potential extubation.  Patient is currently intubated on ventilator support.  MV: 5.7 L/min Temp (24hrs), Avg:98.3 F (36.8 C), Min:97.8 F (36.6 C), Max:98.9 F (37.2 C)  Propofol: 1.3 ml/hr providing 34 kcals per day.  Height: Ht Readings from Last 1 Encounters:  10/27/13 5\' 1"  (1.549 m)    Weight Status:   Wt Readings from Last 1 Encounters:  10/29/13 88 lb 10 oz (40.2 kg)  10/28/13 84 lb 7 oz (38.3 kg)   Re-estimated needs:  Kcal: 1050  Protein: 55-65 gm  Fluid: 1.1-1.2 L  Skin: no wounds  Diet Order: NPO   Intake/Output Summary (Last 24 hours) at 10/29/13  1017 Last data filed at 10/29/13 0600  Gross per 24 hour  Intake   2318 ml  Output   1660 ml  Net    658 ml    Last BM: none documented since admission   Labs:   Recent Labs Lab 10/27/13 1558 10/28/13 0432 10/29/13 0450  NA 136 138 139  K 2.9* 3.5 3.6  CL 98 102 105  CO2 32 26 25  BUN 12 13 13   CREATININE 1.07 1.18* 1.19*  CALCIUM 7.7* 7.4* 7.8*  MG 1.3*  --  1.7  PHOS 1.3*  --  2.4  GLUCOSE 87 83 101*    CBG (last 3)   Recent Labs  10/28/13 2345 10/29/13 0449 10/29/13 0810  GLUCAP 91 97 83    Scheduled Meds: . albuterol  2.5 mg Nebulization Q6H  . antiseptic oral rinse  1 application Mouth Rinse QID  . budesonide  0.25 mg Nebulization Q6H  . chlorhexidine  15 mL Mouth/Throat BID  . famotidine  20 mg Per Tube Daily  . feeding supplement (VITAL AF 1.2 CAL)  1,000 mL Per Tube Q24H  . heparin subcutaneous  5,000 Units Subcutaneous Q8H  . ipratropium  0.5 mg Nebulization Q6H    Continuous Infusions: . sodium chloride 100 mL/hr at 10/27/13 2052  . propofol 5 mcg/kg/min (10/29/13 0939)    Joaquin Courts, RD, LDN, CNSC Pager [phone] After Hours Pager [phone]

## 2013-10-29 NOTE — Progress Notes (Signed)
PULMONARY  / CRITICAL CARE MEDICINE  Name: Brandy [name] MRN: [id number] DOB: Mar 19, 1946    ADMISSION DATE:  10/27/2013 CONSULTATION DATE:  10/27/2013  REFERRING MD :  Maryruth Bun ICU PRIMARY SERVICE:  PCCM  CHIEF COMPLAINT:  Acute respiratory failure  BRIEF PATIENT DESCRIPTION: 67 yo with past medical history of COPD and CHF transferred from [facility] after being admitted x 2 weeks for acute respiratory failure.  Extubated 12/08.  Re intubated 12/09.  Course was complicated by pneumothorax requiring chest tube placement.  SIGNIFICANT EVENTS / STUDIES:  11/30  Admitted to [facility] with acute respiratory failure, failed BiPAP, intubated 12/08  Extubated 12/09  Re intubated 12/09  Pneumothorax / R chest tube placed 12/10 Transferred to [facility]  LINES / TUBES: ETT 12/09 >> 12/12 R [place] CVL 12/09 >>  R chest tube 12/09 >>   CULTURES:  ANTIBIOTICS:   INTERVAL HISTORY: RASS 0 to +1. Passed SBT and extubated. Tenuous immediately post extubation  VITAL SIGNS: Temp:  [97.8 F (36.6 C)-98.5 F (36.9 C)] 98.4 F (36.9 C) (12/12 1200) Pulse Rate:  [62-124] 111 (12/12 1206) Resp:  [5-23] 21 (12/12 1206) BP: (66-136)/(33-91) 115/62 mmHg (12/12 1206) SpO2:  [91 %-100 %] 100 % (12/12 1206) FiO2 (%):  [4 %-30 %] 4 % (12/12 1206) Weight:  [40.2 kg (88 lb 10 oz)] 40.2 kg (88 lb 10 oz) (12/12 0500) HEMODYNAMICS:   VENTILATOR SETTINGS: Vent Mode:  [-] PSV;CPAP FiO2 (%):  [4 %-30 %] 4 % Set Rate:  [12 bmp] 12 bmp Vt Set:  [400 mL] 400 mL PEEP:  [5 cmH20] 5 cmH20 Pressure Support:  [5 cmH20-10 cmH20] 5 cmH20 Plateau Pressure:  [14 cmH20-31 cmH20] 31 cmH20  INTAKE / OUTPUT: Intake/Output     12/11 0701 - 12/12 0700 12/12 0701 - 12/13 0700   I.V. (mL/kg) 2489.9 (61.9) 112.2 (2.8)   NG/GT 262 40   IV Piggyback 50    Total Intake(mL/kg) 2801.9 (69.7) 152.2 (3.8)   Urine (mL/kg/hr) 1625 (1.7) 66 (0.3)   Chest Tube 310 (0.3)    Total Output 1935 66   Net +866.9 +86.2        Stool Occurrence 2 x      PHYSICAL EXAMINATION: General: RASS 0. + F/C. Mild dyspnea post extubation Neuro: diffusely weak. No focal deficits HEENT: WNL Cardiovascular: RRR s M Lungs: diminshed BS, no wheezes anteriorly Abdomen:  Soft, nontender, +BS Ext: no edema  LABS: I have reviewed all of today's lab results. Relevant abnormalities are discussed in the A/P section  CXR: hyperinflation, no PTX, NSC SQ emphysema, NSC retrocardiac density, small L>R effusions  ASSESSMENT / PLAN:  PULMONARY A:  Acute on chronic respiratory failure.  AECOPD.  R PTX with persistent air leak P:   Monitor in ICU closely post extubation Cont BDs and nebulized steroids Cont chest tube to suction If fails extubation, will need trach tube  CARDIOVASCULAR A: No acute issues P:  Monitor  RENAL A:  Hypokalemia, resolved Hypomagnesemia Hypophosphatemia P:   Monitor BMET intermittently Correct electrolytes as indicated  GASTROINTESTINAL A:  Severe protein calorie malnutrition Risk of refeeding syndrome P:   SUP: famotidine NPO post extubation Consider diet 12/13  HEMATOLOGIC A:  Anemia without acute blood loss P:  DVT px: sq heparin Monitor CBC  INFECTIOUS A:  No definite acute infection P:   Micro and abx as above  ENDOCRINE  A:  No active issues. P:   Monitor glu on chem panels Begin SSI for glu >  180  NEUROLOGIC A:  Acute encephalopathy History of alcohol abuse P:   Minimize sedating meds post extubation  I have personally obtained history, examined patient, evaluated and interpreted laboratory and imaging results, reviewed medical records, formulated assessment / plan and placed orders.  CRITICAL CARE:  The patient is critically ill with multiple organ systems failure and requires high complexity decision making for assessment and support, frequent evaluation and titration of therapies, application of advanced monitoring technologies and extensive interpretation of  multiple databases. Critical Care Time devoted to patient care services described in this note is 40 minutes.   Billy Fischer, MD Pulmonary and Critical Care Medicine [facility] Pager: [phone]  10/29/2013, 1:30 PM

## 2013-10-30 ENCOUNTER — Inpatient Hospital Stay (HOSPITAL_COMMUNITY): Payer: Medicare Other

## 2013-10-30 LAB — CBC
HCT: 23.4 % — ABNORMAL LOW (ref 36.0–46.0)
Hemoglobin: 7.8 g/dL — ABNORMAL LOW (ref 12.0–15.0)
MCH: 26.8 pg (ref 26.0–34.0)
MCHC: 33.3 g/dL (ref 30.0–36.0)
MCV: 80.4 fL (ref 78.0–100.0)
Platelets: 185 K/uL (ref 150–400)
RBC: 2.91 MIL/uL — ABNORMAL LOW (ref 3.87–5.11)
RDW: 16.7 % — ABNORMAL HIGH (ref 11.5–15.5)
WBC: 8.7 K/uL (ref 4.0–10.5)

## 2013-10-30 LAB — MAGNESIUM: Magnesium: 1.7 mg/dL (ref 1.5–2.5)

## 2013-10-30 LAB — BASIC METABOLIC PANEL
BUN: 14 mg/dL (ref 6–23)
CO2: 24 mEq/L (ref 19–32)
Calcium: 8 mg/dL — ABNORMAL LOW (ref 8.4–10.5)
Chloride: 109 mEq/L (ref 96–112)
Creatinine, Ser: 1.17 mg/dL — ABNORMAL HIGH (ref 0.50–1.10)
GFR calc non Af Amer: 47 mL/min — ABNORMAL LOW (ref >90)
Glucose, Bld: 98 mg/dL (ref 70–99)

## 2013-10-30 LAB — GLUCOSE, CAPILLARY
Glucose-Capillary: 115 mg/dL — ABNORMAL HIGH (ref 70–99)
Glucose-Capillary: 120 mg/dL — ABNORMAL HIGH (ref 70–99)
Glucose-Capillary: 63 mg/dL — ABNORMAL LOW (ref 70–99)
Glucose-Capillary: 83 mg/dL (ref 70–99)
Glucose-Capillary: 83 mg/dL (ref 70–99)

## 2013-10-30 LAB — PHOSPHORUS: Phosphorus: 2.9 mg/dL (ref 2.3–4.6)

## 2013-10-30 MED ORDER — CLONAZEPAM 0.5 MG PO TABS
0.5000 mg | ORAL_TABLET | Freq: Two times a day (BID) | ORAL | Status: DC
  Administered 2013-10-30 – 2013-11-16 (×30): 0.5 mg via ORAL
  Filled 2013-10-30 (×33): qty 1

## 2013-10-30 MED ORDER — SODIUM CHLORIDE 0.9 % IV BOLUS (SEPSIS)
1000.0000 mL | Freq: Once | INTRAVENOUS | Status: AC
  Administered 2013-10-30: 1000 mL via INTRAVENOUS

## 2013-10-30 MED ORDER — MAGNESIUM SULFATE 40 MG/ML IJ SOLN
2.0000 g | Freq: Once | INTRAMUSCULAR | Status: AC
  Administered 2013-10-30: 2 g via INTRAVENOUS
  Filled 2013-10-30: qty 50

## 2013-10-30 NOTE — Progress Notes (Addendum)
    10/29/13  1940  Pt extremely anxious, attempting to crawl out of bed, diminished breath sounds bilaterally L>R,  Chest tube assessed No kinks/ leaks noted, still connected to suction.  O2 sats 70. RT and fellow at [facility].  VM to NRB to BPAP. Elink MD aware. Pt intubated.    Carlyon Prows RN

## 2013-10-30 NOTE — Progress Notes (Signed)
10/29/13  2350  CRITICAL VALUE ALERT  Critical value received:  cbg 63  Date of notification:  12/12  Time of notification:  2340  Critical value read back:yes  Nurse who received alert:  Carlyon Prows RN   MD notified (1st page):  Henry Russel   Time of first page:  2340   MD notified (2nd page):  Time of second page:  Responding MD:  Henry Russel   Time MD responded:  2345  PLAN" RESUME TUBE FEEDING PER PREVIOUS ORDER"

## 2013-10-30 NOTE — Progress Notes (Signed)
[facility] ADULT ICU REPLACEMENT PROTOCOL FOR AM LAB REPLACEMENT ONLY  The patient does apply for the [facility] Adult ICU Electrolyte Replacment Protocol based on the criteria listed below:   1. Is GFR >/= 40 ml/min? yes  Patient's GFR today is 54 2. Is urine output >/= 0.5 ml/kg/hr for the last 6 hours? yes Patient's UOP is 1.2 ml/kg/hr 3. Is BUN < 60 mg/dL? yes  Patient's BUN today is 14 4. Abnormal electrolyte(s): Mag 1.7 5. Ordered repletion with: per protocol 6. If a panic level lab has been reported, has the CCM MD in charge been notified? no.   Physician:    Markus Daft A 10/30/2013 4:39 AM

## 2013-10-30 NOTE — Progress Notes (Signed)
eLink Physician-Brief Progress Note Patient Name: Brandy [name] DOB: Jul 22, 1946 MRN: [id number]  Date of Service  10/30/2013   HPI/Events of Note  Patient agitated frequently while on mech vent.  On propofol drip which has significant affect on bp.   eICU Interventions  Lower prop drip to allow bp to recover and try low dose of fentanyl intermittently.  If this does not help will consider transition from propofol to versed.      Henry Russel, P 10/30/2013, 1:30 AM

## 2013-10-30 NOTE — Progress Notes (Signed)
PULMONARY  / CRITICAL CARE MEDICINE  Name: Brandy [name] MRN: [id number] DOB: December 10, 1945    ADMISSION DATE:  10/27/2013 CONSULTATION DATE:  10/27/2013  REFERRING MD :  Maryruth Bun ICU PRIMARY SERVICE:  PCCM  CHIEF COMPLAINT:  Acute respiratory failure  BRIEF PATIENT DESCRIPTION: 67 yo with past medical history of COPD and CHF transferred from [facility] after being admitted x 2 weeks for acute respiratory failure.  Extubated 12/08.  Re intubated 12/09.  Course was complicated by pneumothorax requiring chest tube placement.  SIGNIFICANT EVENTS / STUDIES:  11/30  Admitted to [facility] with acute respiratory failure, failed BiPAP, intubated 12/08  Extubated 12/09  Re intubated 12/09  Pneumothorax / R chest tube placed 12/10 Transferred to [facility]  LINES / TUBES: ETT 12/09 >> 12/12, 12/12>>> R [place] CVL 12/09 >>  R chest tube 12/09 >>   CULTURES:  ANTIBIOTICS:   INTERVAL HISTORY: Failed extubation, reintubated overnight.  Intermittently agitated.   VITAL SIGNS: Temp:  [97.9 F (36.6 C)-98.6 F (37 C)] 98.4 F (36.9 C) (12/13 0744) Pulse Rate:  [25-155] 129 (12/13 0900) Resp:  [10-26] 19 (12/13 0900) BP: (60-195)/(34-87) 110/68 mmHg (12/13 0900) SpO2:  [87 %-100 %] 99 % (12/13 0900) FiO2 (%):  [30 %-50 %] 40 % (12/13 0844) Weight:  [88 lb 6.5 oz (40.1 kg)] 88 lb 6.5 oz (40.1 kg) (12/13 0500) HEMODYNAMICS:   VENTILATOR SETTINGS: Vent Mode:  [-] PRVC FiO2 (%):  [30 %-50 %] 40 % Set Rate:  [10 bmp-12 bmp] 12 bmp Vt Set:  [400 mL] 400 mL PEEP:  [5 cmH20] 5 cmH20 Pressure Support:  [5 cmH20-12 cmH20] 5 cmH20 Plateau Pressure:  [13 cmH20-28 cmH20] 16 cmH20  INTAKE / OUTPUT: Intake/Output     12/12 0701 - 12/13 0700 12/13 0701 - 12/14 0700   I.V. (mL/kg) 2390.8 (59.6) 44.8 (1.1)   NG/GT 250 35   IV Piggyback  50   Total Intake(mL/kg) 2640.8 (65.9) 129.8 (3.2)   Urine (mL/kg/hr) 751 (0.8) 165 (1.5)   Stool 1 (0)    Chest Tube 80 (0.1)    Total Output 832 165   Net  +1808.8 -35.2          PHYSICAL EXAMINATION: General: frail, thin, chronically ill appearing female, NAD  Neuro: diffusely weak, intermittent agitation per RN  HEENT: ETT, mm dry  Cardiovascular: RRR s M Lungs: resps even non labored on vent, diminshed BS, no wheezes anteriorly, persistent air leak Abdomen:  Soft, nontender, +BS Ext: no edema  LABS:  Recent Labs Lab 10/28/13 0432 10/29/13 0450 10/30/13 0315  HGB 8.1* 7.7* 7.8*  HCT 24.0* 23.8* 23.4*  WBC 10.8* 9.8 8.7  PLT 155 166 185    Recent Labs Lab 10/27/13 1558 10/28/13 0432 10/29/13 0450 10/30/13 0315  NA 136 138 139 141  K 2.9* 3.5 3.6 3.7  CL 98 102 105 109  CO2 32 26 25 24   GLUCOSE 87 83 101* 98  BUN 12 13 13 14   CREATININE 1.07 1.18* 1.19* 1.17*  CALCIUM 7.7* 7.4* 7.8* 8.0*  MG 1.3*  --  1.7 1.7  PHOS 1.3*  --  2.4 2.9    Recent Labs Lab 10/27/13 1558  AST 29  ALT 28  ALKPHOS 59  BILITOT 0.7  PROT 4.3*  ALBUMIN 2.2*  INR 1.26     CXR: increased R ptx, hyperinflation   ASSESSMENT / PLAN:  PULMONARY A:  Acute on chronic respiratory failure - failed extubation x 2  AECOPD.  R  PTX with persistent air leak P:   Will need trach placement early next week  Continue BDs and nebulized steroids Continue chest tube to suction CT chest to eval worsening hydroptx, ?bpf >> depending on results will decide if she needs TCTS evaluation  CARDIOVASCULAR A: No acute issues P:  Monitor  RENAL A:  Hypokalemia, resolved Hypomagnesemia Hypophosphatemia P:   Monitor BMET intermittently Correct electrolytes as indicated  GASTROINTESTINAL A:  Severe protein calorie malnutrition Risk of refeeding syndrome P:   SUP: famotidine Resume TF    HEMATOLOGIC A:  Anemia without acute blood loss P:  DVT px: sq heparin Monitor CBC  INFECTIOUS A:  No definite acute infection P:   Trend wbc, fever curve   ENDOCRINE  A: no active issue  P:   Monitor glu on chem panels Begin SSI for glu >  180  NEUROLOGIC A:  Acute encephalopathy History of alcohol abuse P:   Continue propofol for now  ??multiple psych meds at home per family - none listed Will ask family to bring med list, pharmacy also working on home med inquiry  Will attempt some low dose enteral sedation and wean off propofol  PRN fentanyl   WHITEHEART,[name], NP 10/30/2013  9:50 AM Pager: [phone] or [phone]  Reviewed above, examined pt, and agree with assessment/plan.  She failed extubation attempt 12/12.  She has persistent airleak, and severe Emphysema.  She also has significant decondition and severe protein/calorie malnutrition.  These will all be limiting factors for further vent weaning.  Concerned she may need trach.  Will get CT chest to better assess pneumothorax, and then decide if TCTS evaluation needed.  Updated family at bedside about plan.  CC time 35 minutes.  Coralyn Helling, MD [facility] Pulmonary/Critical Care 10/30/2013, 1:55 PM Pager:  [phone] After 3pm call: [phone]

## 2013-10-31 DIAGNOSIS — J930 Spontaneous tension pneumothorax: Secondary | ICD-10-CM

## 2013-10-31 LAB — BASIC METABOLIC PANEL
BUN: 15 mg/dL (ref 6–23)
CO2: 25 mEq/L (ref 19–32)
Chloride: 110 mEq/L (ref 96–112)
Creatinine, Ser: 1.11 mg/dL — ABNORMAL HIGH (ref 0.50–1.10)
GFR calc Af Amer: 58 mL/min — ABNORMAL LOW (ref >90)
Glucose, Bld: 107 mg/dL — ABNORMAL HIGH (ref 70–99)
Potassium: 3.6 mEq/L (ref 3.5–5.1)

## 2013-10-31 LAB — CBC
MCH: 26.7 pg (ref 26.0–34.0)
MCHC: 32.2 g/dL (ref 30.0–36.0)
Platelets: 233 10*3/uL (ref 150–400)
RDW: 16.8 % — ABNORMAL HIGH (ref 11.5–15.5)

## 2013-10-31 LAB — GLUCOSE, CAPILLARY
Glucose-Capillary: 108 mg/dL — ABNORMAL HIGH (ref 70–99)
Glucose-Capillary: 115 mg/dL — ABNORMAL HIGH (ref 70–99)
Glucose-Capillary: 88 mg/dL (ref 70–99)

## 2013-10-31 LAB — PHOSPHORUS: Phosphorus: 3.1 mg/dL (ref 2.3–4.6)

## 2013-10-31 LAB — MAGNESIUM: Magnesium: 2.1 mg/dL (ref 1.5–2.5)

## 2013-10-31 MED ORDER — IPRATROPIUM BROMIDE 0.02 % IN SOLN: 0.5000 mg | RESPIRATORY_TRACT | Status: DC | PRN

## 2013-10-31 MED ORDER — QUETIAPINE FUMARATE 50 MG PO TABS
50.0000 mg | ORAL_TABLET | Freq: Two times a day (BID) | ORAL | Status: DC
  Administered 2013-10-31 – 2013-11-07 (×14): 50 mg via ORAL
  Filled 2013-10-31 (×19): qty 1

## 2013-10-31 MED ORDER — ALBUTEROL SULFATE (2.5 MG/3ML) 0.083% IN NEBU
2.5000 mg | INHALATION_SOLUTION | RESPIRATORY_TRACT | Status: DC | PRN
  Administered 2013-11-09: 2.5 mg via RESPIRATORY_TRACT
  Filled 2013-10-31: qty 3
  Filled 2013-10-31: qty 0.5

## 2013-10-31 MED ORDER — ARFORMOTEROL TARTRATE 15 MCG/2ML IN NEBU
15.0000 ug | INHALATION_SOLUTION | Freq: Two times a day (BID) | RESPIRATORY_TRACT | Status: DC
  Administered 2013-10-31 – 2013-11-16 (×29): 15 ug via RESPIRATORY_TRACT
  Filled 2013-10-31 (×38): qty 2

## 2013-10-31 MED ORDER — SODIUM CHLORIDE 0.9 % IV BOLUS (SEPSIS)
1000.0000 mL | Freq: Once | INTRAVENOUS | Status: AC
  Administered 2013-10-31: 1000 mL via INTRAVENOUS

## 2013-10-31 NOTE — Progress Notes (Signed)
PULMONARY  / CRITICAL CARE MEDICINE  Name: Brandy [name] MRN: [id number] DOB: [birth year]    ADMISSION DATE:  10/27/2013 CONSULTATION DATE:  10/27/2013  REFERRING MD :  Brandy [name] ICU PRIMARY SERVICE:  PCCM  CHIEF COMPLAINT:  Acute respiratory failure  BRIEF PATIENT DESCRIPTION: 67 yo with past medical history of COPD and CHF transferred from [facility] after being admitted x 2 weeks for acute respiratory failure.  Extubated 12/08.  Re intubated 12/09.  Course was complicated by pneumothorax requiring chest tube placement.  SIGNIFICANT EVENTS / STUDIES:  11/30  Admitted to [facility] with acute respiratory failure, failed BiPAP, intubated 12/08  Extubated 12/09  Re intubated 12/09  Pneumothorax / R chest tube placed 12/10 Transferred to [facility] 12/13 CT chest>>> R ptx 25-30%, lung tethered to pleural surface in multiple locations, R>L effusion, LLL PNA, stable RLL nodule ; TCTS consulted  LINES / TUBES: ETT 12/09 >> 12/12, 12/12>>> R [place] CVL 12/09 >>  R chest tube 12/09 >>   CULTURES:  ANTIBIOTICS:   INTERVAL HISTORY: Remains intermittently agitated, failed SBT r/t increased WOB, tachypnea.   VITAL SIGNS: Temp:  [98 F (36.7 C)-99.1 F (37.3 C)] 98.3 F (36.8 C) (12/14 0416) Pulse Rate:  [8-134] 134 (12/14 0821) Resp:  [10-29] 17 (12/14 0821) BP: (71-153)/(37-106) 149/70 mmHg (12/14 0821) SpO2:  [97 %-100 %] 100 % (12/14 0821) FiO2 (%):  [40 %] 40 % (12/14 0821) HEMODYNAMICS: CVP:  [7 mmHg] 7 mmHg VENTILATOR SETTINGS: Vent Mode:  [-] PRVC FiO2 (%):  [40 %] 40 % Set Rate:  [12 bmp] 12 bmp Vt Set:  [400 mL] 400 mL PEEP:  [5 cmH20] 5 cmH20 Pressure Support:  [5 cmH20] 5 cmH20 Plateau Pressure:  [16 cmH20-26 cmH20] 16 cmH20  INTAKE / OUTPUT: Intake/Output     12/13 0701 - 12/14 0700 12/14 0701 - 12/15 0700   I.V. (mL/kg) 935.4 (23.3)    NG/GT 945    IV Piggyback 50    Total Intake(mL/kg) 1930.4 (48.1)    Urine (mL/kg/hr) 755 (0.8)    Stool     Chest Tube  236 (0.2)    Total Output 991     Net +939.4            PHYSICAL EXAMINATION: General: frail, thin, chronically ill appearing female, NAD  Neuro: diffusely weak, intermittent agitation HEENT: ETT, mm dry  Cardiovascular: RRR s M Lungs: resps even non labored on vent, diminshed BS, no wheezes anteriorly, persistent air leak R CT Abdomen:  Soft, nontender, +BS Ext: no edema  LABS:  Recent Labs Lab 10/29/13 0450 10/30/13 0315 10/31/13 0500  HGB 7.7* 7.8* 7.4*  HCT 23.8* 23.4* 23.0*  WBC 9.8 8.7 10.7*  PLT 166 185 233    Recent Labs Lab 10/27/13 1558 10/28/13 0432 10/29/13 0450 10/30/13 0315 10/31/13 0500  NA 136 138 139 141 141  K 2.9* 3.5 3.6 3.7 3.6  CL 98 102 105 109 110  CO2 32 26 25 24 25   GLUCOSE 87 83 101* 98 107*  [name] 12 13 13 14 15   CREATININE 1.07 1.18* 1.19* 1.17* 1.11*  CALCIUM 7.7* 7.4* 7.8* 8.0* 7.7*  MG 1.3*  --  1.7 1.7 2.1  PHOS 1.3*  --  2.4 2.9 3.1    Recent Labs Lab 10/27/13 1558  AST 29  ALT 28  ALKPHOS 59  BILITOT 0.7  PROT 4.3*  ALBUMIN 2.2*  INR 1.26     CXR: increased R ptx, hyperinflation   ASSESSMENT /  PLAN:  PULMONARY A:  Acute on chronic respiratory failure - failed extubation x 2  AECOPD.  R PTX with persistent air leak P:   Will need trach placement early next week  Continue BDs and nebulized steroids Continue chest tube to suction CVTS to see - spoke with Brandy [name]   CARDIOVASCULAR A: No acute issues P:  Monitor  RENAL A:  Hypokalemia, resolved Hypomagnesemia Hypophosphatemia P:   Monitor BMET intermittently Correct electrolytes as indicated  GASTROINTESTINAL A:  Severe protein calorie malnutrition Risk of refeeding syndrome P:   SUP: famotidine Continue TF    HEMATOLOGIC A:  Anemia without acute blood loss P:  DVT px: sq heparin Monitor CBC  INFECTIOUS A:  No definite acute infection P:   Trend wbc, fever curve   ENDOCRINE  A: no active issue  P:   Monitor glu on chem panels Begin  SSI for glu > 180  NEUROLOGIC A:  Acute encephalopathy History of alcohol abuse P:   Continue propofol for now  ??multiple psych meds at home per family - none listed Will ask family to bring med list, pharmacy also working on home med inquiry  Will escalate enteral sedation and attempt wean off propofol  PRN fentanyl   [name],[name], NP 10/31/2013  8:52 AM Pager: [phone] or [phone]  Reviewed above, examined pt, and agree with assessment/plan.  She has persistent air leak, and moderate PTX on CT chest.  Appreciate assistance from TCTS.  Updated pt's mother at bedside.  CC time 35 minutes.  Coralyn Helling, MD [facility] Pulmonary/Critical Care 10/31/2013, 1:32 PM Pager:  [phone] After 3pm call: [phone]

## 2013-10-31 NOTE — Consult Note (Signed)
[address]       [place] [address]             [phone]        [name] Tatum Churdan Medical Record #[id number] Date of Birth: 28-Jan-1946  Referring: Dr. Marin Shutter  Primary Care: Evlyn Courier, MD  Reason for consult: Persistent right pneumothorax with air leak     History of Present Illness:     This is a 67 year old African American  female with a past medical history of COPD and CHF who was transferred from [facility] on 10/27/2013 for further evaluation and treatment for respiratory failure. She had been hospitalized since 11/30 (almost 2 weeks) at [facility] prior to transfer. According to medical records, she was empirically started on Rocephin and Levaquin for possible pneumonia. She was also treated with IV steroids, albuterol, and ipratropium. Although she had no fever and sputum culture was apparently negative, she was started on Vancomycin and Zithromax as it was felt she may have underlying infection. Her respiratory status eventually improved and she she extubated the evening of 12/08. Initially she remained stable;however, she then developed tachypnea and confusion. Her clinical course deteriorated and she had to be re intubated on 12/09. CXR also showed she also developed a right pneuothorax (approximately 40%, likely from central line placement) and required a right chest tube placement. There was re expansion of the lung. Dr. Doyne Keel (her admitting physician) spoke with the family and it was felt the patient required transfer to [facility] as she would require prolonged mechanical ventilation, possible trach, etc. She continues to have a right pneumothorax with air leak from chest tube. Dr. Tyrone Sage has been consulted for chest tube management as well as to determine whether or not she would require thoracic surgical intervention.  Current Activity/ Functional Status: According to nurse, patient oxygen dependent prior to  admission. Very limited mobility.    Zubrod Score: At the time of surgery, this patient's most appropriate activity status/level should be described as: []  Normal activity, no symptoms []  Symptoms, fully ambulatory []  Symptoms, in bed less than or equal to 50% of the time []  Symptoms, in bed greater than 50% of the time but less than 100% [x]  Bedridden []  Moribund  Past Medical History  Diagnosis Date  . COPD (chronic obstructive pulmonary disease)   . CHF (congestive heart failure)   Hypertension  No pertinent past surgical history.   Social History  . Marital Status: Divorced    Spouse Name: N/A    Number of Children: N/A  . Years of Education: N/A   Occupational History  . Not on file.   Social History Main Topics  . Smoking status: Unknown If Ever Smoked  . Smokeless tobacco: Not on file  . Alcohol Use: Not on file  . Drug Use: Not on file  . Sexual Activity: Not on file    Allergies: No Known Allergies  Current Facility-Administered Medications  Medication Dose Route Frequency Provider Last Rate Last Dose  . 0.9 %  sodium chloride infusion   Intravenous Continuous Merwyn Katos, MD 40 mL/hr at 10/31/13 0914    . albuterol (PROVENTIL) (5 MG/ML) 0.5% nebulizer solution 2.5 mg  2.5 mg Nebulization Q6H Merwyn Katos, MD   2.5 mg at 10/31/13 0732  . albuterol (PROVENTIL) (5 MG/ML) 0.5% nebulizer solution 2.5 mg  2.5 mg Nebulization Q3H PRN Merwyn Katos, MD   2.5 mg at 10/29/13  1914  . antiseptic oral rinse (BIOTENE) solution 15 mL  1 application Mouth Rinse QID Lonia Farber, MD   15 mL at 10/31/13 0400  . budesonide (PULMICORT) nebulizer solution 0.25 mg  0.25 mg Nebulization Q6H Merwyn Katos, MD   0.25 mg at 10/31/13 0732  . chlorhexidine (PERIDEX) 0.12 % solution 15 mL  15 mL Mouth/Throat BID Lonia Farber, MD   15 mL at 10/31/13 0849  . clonazePAM (KLONOPIN) tablet 0.5 mg  0.5 mg Oral BID Bernadene Person, NP   0.5 mg at 10/30/13  2119  . famotidine (PEPCID) 40 MG/5ML suspension 20 mg  20 mg Per Tube Daily Merwyn Katos, MD   20 mg at 10/30/13 1100  . feeding supplement (VITAL AF 1.2 CAL) liquid 1,000 mL  1,000 mL Per Tube Q24H Deanna Artis, MD 35 mL/hr at 10/31/13 0030 1,000 mL at 10/31/13 0030  . fentaNYL (SUBLIMAZE) injection 12.5-25 mcg  12.5-25 mcg Intravenous Q2H PRN Merwyn Katos, MD   25 mcg at 10/30/13 1701  . heparin injection 5,000 Units  5,000 Units Subcutaneous Q8H Lonia Farber, MD   5,000 Units at 10/31/13 [phone]  . ipratropium (ATROVENT) nebulizer solution 0.5 mg  0.5 mg Nebulization Q6H Merwyn Katos, MD   0.5 mg at 10/31/13 0732  . propofol (DIPRIVAN) 10 mg/ml infusion  0-50 mcg/kg/min Intravenous Continuous Oda Cogan, MD 4.8 mL/hr at 10/31/13 0844 20 mcg/kg/min at 10/31/13 0844  . QUEtiapine (SEROQUEL) tablet 50 mg  50 mg Oral BID Bernadene Person, NP        Prescriptions prior to admission  Medication Sig Dispense Refill  . budesonide-formoterol (SYMBICORT) 80-4.5 MCG/ACT inhaler Inhale 2 puffs into the lungs 2 (two) times daily.      . Ipratropium-Albuterol (COMBIVENT RESPIMAT) 20-100 MCG/ACT AERS respimat Inhale 1 puff into the lungs every 6 (six) hours.      Marland Kitchen lisinopril-hydrochlorothiazide (PRINZIDE,ZESTORETIC) 20-12.5 MG per tablet Take 1 tablet by mouth daily.      Marland Kitchen lovastatin (MEVACOR) 20 MG tablet Take 20 mg by mouth daily.        History reviewed. No pertinent family history.   Review of Systems:     Cardiac Review of Systems: Y or N. Obtained from medical records.  Chest Pain [  n  ]  Resting SOB [ y  ] Exertional SOB  [ y ]  Syncope  [ n ]   Presyncope [  n ]  General Review of Systems: [Y] = yes [  ]=no Constitional: recent weight change [place].Etienne ]; anorexia [ y ]; fatigue [ y ]; nausea [ n ]; night sweats [n  ]; fever [n  ]; or chills [ n ]                                                                Eye : blurred vision [n  ]; diplopia [  n ]; vision changes [n  ];   Amaurosis fugax[ n ]; Resp: cough [n  ];  wheezing[ n ];  hemoptysis[n  ];  GI:   vomiting[n  ];  dysphagia[n  ]; melena[ n ];  hematochezia [ n ]; heartburn[ n ] GU: kidney stones [ n ]; hematuria[n  ];   dysuria [  n ];  nocturia[ n ]; urinary frequency [ n ]             Skin: rash, swelling[ n ];,or itching[n  ]; Musculosketetal: myalgias[ n ];  joint swelling[ n ];  joint erythema[ n ];  joint pain[ n ];  back pain[ n ];  Heme/Lymph: bruising[n  ];  bleeding[n  ];  anemia[y ];  Neuro: TIA[ n ];  headaches[ n ];  stroke[n  ];  vertigo[ n ];  seizures[ n ];   paresthesias[ n ];    Endocrine: diabetes[n  ];  thyroid dysfunction[ n ];   Physical Exam: BP 158/71  Pulse 131  Temp(Src) 98.3 F (36.8 C) (Oral)  Resp 20  Ht 5\' 1"  (1.549 m)  Wt 40.1 kg (88 lb 6.5 oz)  BMI 16.71 kg/m2  SpO2 100%  General appearance: Cachetic, appears older than stated age and no distress Neurologic: intact Heart:Slightly tachy, S1, S2 normal, no murmur, click, rub or gallop Lungs: Diminshed at bases and coarse otherwise Abdomen: soft, non-tender; bowel sounds normal; no masses,  no organomegaly Extremities: No cyanosis, clubbing, or edema HEENT: head-normalcephalic Eyes-EOMI,  PERRLA Neck-supple  Diagnostic Studies & Laboratory data:     Recent Radiology Findings:   Ct Chest Wo Contrast  10/31/2013   CLINICAL DATA:  Chronic respiratory failure, ventilator dependent currently, failed extubation twice during this admission, with enlarging right pneumothorax despite chest tube placement.  EXAM: CT CHEST WITHOUT CONTRAST  TECHNIQUE: Multidetector CT imaging of the chest was performed following the standard protocol without IV contrast.  COMPARISON:  Multiple recent portable chest x-rays. CTA chest 10/17/2013 and CT chest 08/31/2004.  FINDINGS: Right pneumothorax on the order of 25-30% or so, with right chest tube in place. The pneumothorax is loculated, as the lung is tethered to the pleura in multiple  locations due to scarring. There is no residual pleural air at the site of the chest tube. The largest component of the residual pneumothorax is at the base of the right he me thorax. There is no left pneumothorax.  Severe bullous emphysematous changes are again demonstrated throughout both lungs. Bilateral pleural effusions, left greater than right. Dense consolidation with air bronchograms in the left lower lobe. Mild passive atelectasis in the right lower lobe. Focal areas of dense atelectasis in the right upper lobe in the perihilar region due to the collapsed lung. 9 mm nodule in the lateral right lower lobe (series 4, image 35), stable dating back to the 2005 examination. No new pulmonary parenchymal nodules or masses. Central airways patent with moderate bronchial wall thickening. Endotracheal tube tip appropriately positioned approximately 4 cm above the carina. Moderate amount of subcutaneous emphysema in the right chest wall extending into the right supraclavicular region.  Heart size normal. No pericardial effusion. Right subclavian central venous catheter tip in the lower SVC near the cavoatrial junction. Mild to moderate atherosclerosis involving the thoracic and upper abdominal aorta without aneurysm. Aortic annular calcification. No visible coronary artery calcification.  No significant mediastinal, hilar, or axillary lymphadenopathy. Visualized thyroid gland atrophic but otherwise unremarkable. Diffuse body wall edema.  Visualized upper abdomen unremarkable for the unenhanced technique. Nasogastric tube in the stomach. Bone window images demonstrate generalized osseous demineralization, moderate diffuse thoracic spondylosis, DISH, and exaggeration of the usual thoracic kyphosis.  IMPRESSION: 1. Right pneumothorax on the order of 25-30% or so, loculated as the lung it is tethered to the pleural surface in multiple locations by scar. There is no residual pleural air  at the site of the chest tube. The  largest residual loculation of pleural air is at the base of the right hemithorax, and if a 2nd chest tube is considered it should be placed in this area. 2. Bilateral pleural effusions, left greater than right. 3. Left lower lobe pneumonia. 4. Passive atelectasis in the right lower lobe and in the central right upper lobe. 5. Benign 9 mm nodule in the lateral right lower lobe stable 2005. 6. Support apparatus satisfactory.   Electronically Signed   By: Hulan Saas M.D.   On: 10/31/2013 08:00   Dg Chest Port 1 View  10/30/2013   CLINICAL DATA:  Respiratory failure  EXAM: PORTABLE CHEST - 1 VIEW  COMPARISON:  10/29/2013  FINDINGS: An endotracheal tube is again identified at the level of the aortic knob well above the carinal. The nasogastric catheter is noted coiled within the stomach. A right-sided subclavian central line is noted in the mid superior vena cava. The lungs are hyperinflated. A small left-sided pleural effusion is again noted. A the right basilar hydro pneumothorax is seen as well as and apical component of the pneumothorax. A right-sided chest tube is noted. The pneumothorax has increased in size when compare with the prior exam particularly in the right lung base.  IMPRESSION: Increase in right-sided hydro pneumothorax.  Stable left pleural effusion.   Electronically Signed   By: Alcide Clever M.D.   On: 10/30/2013 07:29   Dg Chest Port 1 View  10/29/2013   CLINICAL DATA:  ET tube insertion.  EXAM: PORTABLE CHEST - 1 VIEW  COMPARISON:  Portable film at 508 a.m.  FINDINGS: Endotracheal tube has been inserted, and lies 2 cm above carina. Recommend withdrawal 2-3 cm.  Right chest tube in good position. Subcutaneous emphysema. There may be a tiny right lateral pneumothorax, of insignificant size. Central venous catheter tip cavoatrial junction. Patchy consolidation left base. No new opacity. Left pleural effusion. COPD.  IMPRESSION: Endotracheal tube has been reinserted and lies 2 cm above  carina. Recommend withdrawal 2-3 cm. Tiny right lateral pneumothorax? Otherwise stable chest.   Electronically Signed   By: Davonna Belling M.D.   On: 10/29/2013 20:34      Recent Lab Findings: Lab Results  Component Value Date   WBC 10.7* 10/31/2013   HGB 7.4* 10/31/2013   HCT 23.0* 10/31/2013   PLT 233 10/31/2013   GLUCOSE 107* 10/31/2013   TRIG 98 10/29/2013   ALT 28 10/27/2013   AST 29 10/27/2013   NA 141 10/31/2013   K 3.6 10/31/2013   CL 110 10/31/2013   CREATININE 1.11* 10/31/2013   BUN 15 10/31/2013   CO2 25 10/31/2013   INR 1.26 10/27/2013      Assessment / Plan:      1.VDRF-needs trach. Per CCM 2.Right pneumothorax-CT scan done yesterday showed complex 25-30% pneumothorax, severe bullous emphysematous changes throughout both lungs, bilateral pleural effusions L>R, consolidation with air bronchogram's in LLL , 9 mm nodule lateral RLL (stable from previously), passive atelectasis RLL. Right chest tube has intermittent +1 air leak.   3.Sever protein calorie malnutrition-on feeding supplement. May need PEG 4.Anemia-H and H 7.4 and 23 this am   Doree Fudge PA-C  10/31/2013 9:49 AM  small intermittent air leak with complex ptx on right with primarily a basilar component - no tension Suggest increasing suction on chest tube- i have increased to 30 Next intervention is to place basal chest tube What is code status? I have seen and  examined Ronnette Hila Litsey and agree with the above assessment  and plan.  Delight Ovens MD Beeper [phone] Office [phone] 10/31/2013 11:25 AM

## 2013-11-01 ENCOUNTER — Inpatient Hospital Stay (HOSPITAL_COMMUNITY): Payer: Medicare Other

## 2013-11-01 DIAGNOSIS — J930 Spontaneous tension pneumothorax: Secondary | ICD-10-CM

## 2013-11-01 LAB — PREPARE RBC (CROSSMATCH)

## 2013-11-01 LAB — CBC
HCT: 20.6 % — ABNORMAL LOW (ref 36.0–46.0)
HCT: 23 % — ABNORMAL LOW (ref 36.0–46.0)
Hemoglobin: 7.7 g/dL — ABNORMAL LOW (ref 12.0–15.0)
MCH: 28.2 pg (ref 26.0–34.0)
MCHC: 33.5 g/dL (ref 30.0–36.0)
MCV: 82.7 fL (ref 78.0–100.0)
MCV: 84.2 fL (ref 78.0–100.0)
Platelets: 217 10*3/uL (ref 150–400)
Platelets: 201 10*3/uL (ref 150–400)
RBC: 2.49 MIL/uL — ABNORMAL LOW (ref 3.87–5.11)
RDW: 16.9 % — ABNORMAL HIGH (ref 11.5–15.5)
RDW: 16.6 % — ABNORMAL HIGH (ref 11.5–15.5)
WBC: 10 10*3/uL (ref 4.0–10.5)

## 2013-11-01 LAB — GLUCOSE, CAPILLARY
Glucose-Capillary: 107 mg/dL — ABNORMAL HIGH (ref 70–99)
Glucose-Capillary: 79 mg/dL (ref 70–99)
Glucose-Capillary: 98 mg/dL (ref 70–99)

## 2013-11-01 LAB — BASIC METABOLIC PANEL
BUN: 18 mg/dL (ref 6–23)
CO2: 23 mEq/L (ref 19–32)
Chloride: 110 mEq/L (ref 96–112)
Creatinine, Ser: 1.07 mg/dL (ref 0.50–1.10)
GFR calc Af Amer: 60 mL/min — ABNORMAL LOW (ref >90)
Potassium: 3.4 mEq/L — ABNORMAL LOW (ref 3.5–5.1)

## 2013-11-01 LAB — TRIGLYCERIDES
Triglycerides: 103 mg/dL (ref <150)
Triglycerides: 78 mg/dL (ref <150)

## 2013-11-01 LAB — ABO/RH: ABO/RH(D): B POS

## 2013-11-01 MED ORDER — POTASSIUM CHLORIDE 20 MEQ/15ML (10%) PO LIQD
20.0000 meq | ORAL | Status: AC
  Administered 2013-11-01 (×2): 20 meq
  Filled 2013-11-01 (×2): qty 15

## 2013-11-01 MED ORDER — MIDAZOLAM HCL 2 MG/2ML IJ SOLN
INTRAMUSCULAR | Status: AC
  Filled 2013-11-01: qty 2

## 2013-11-01 MED ORDER — MIDAZOLAM HCL 2 MG/2ML IJ SOLN
4.0000 mg | Freq: Once | INTRAMUSCULAR | Status: AC
  Administered 2013-11-02: 4 mg via INTRAVENOUS
  Filled 2013-11-01: qty 4

## 2013-11-01 MED ORDER — MIDAZOLAM HCL 2 MG/2ML IJ SOLN
2.0000 mg | INTRAMUSCULAR | Status: DC | PRN
Start: 2013-11-01 — End: 2013-11-16
  Administered 2013-11-01 – 2013-11-14 (×13): 2 mg via INTRAVENOUS
  Administered 2013-11-14: 1 mg via INTRAVENOUS
  Administered 2013-11-15 – 2013-11-16 (×5): 2 mg via INTRAVENOUS
  Filled 2013-11-01 (×20): qty 2

## 2013-11-01 MED ORDER — FENTANYL CITRATE 0.05 MG/ML IJ SOLN: 50.0000 ug | Freq: Once | INTRAMUSCULAR | Status: DC

## 2013-11-01 MED ORDER — VECURONIUM BROMIDE 10 MG IV SOLR
10.0000 mg | Freq: Once | INTRAVENOUS | Status: DC
  Filled 2013-11-01: qty 10

## 2013-11-01 MED ORDER — DEXMEDETOMIDINE HCL IN NACL 200 MCG/50ML IV SOLN
0.0000 ug/kg/h | INTRAVENOUS | Status: DC
  Administered 2013-11-01: 0.2 ug/kg/h via INTRAVENOUS
  Filled 2013-11-01: qty 50

## 2013-11-01 MED ORDER — ETOMIDATE 2 MG/ML IV SOLN
40.0000 mg | Freq: Once | INTRAVENOUS | Status: DC
  Filled 2013-11-01 (×2): qty 20

## 2013-11-01 MED ORDER — FENTANYL CITRATE 0.05 MG/ML IJ SOLN
200.0000 ug | Freq: Once | INTRAMUSCULAR | Status: AC
  Administered 2013-11-02: 200 ug via INTRAVENOUS
  Filled 2013-11-01: qty 4

## 2013-11-01 MED ORDER — PROPOFOL 10 MG/ML IV EMUL: 5.0000 ug/kg/min | Freq: Once | INTRAVENOUS | Status: DC

## 2013-11-01 MED ORDER — MIDAZOLAM HCL 2 MG/2ML IJ SOLN
2.0000 mg | Freq: Once | INTRAMUSCULAR | Status: AC
  Administered 2013-11-01: 2 mg via INTRAVENOUS

## 2013-11-01 MED ORDER — SODIUM CHLORIDE 0.9 % IV SOLN
0.0000 ug/h | INTRAVENOUS | Status: DC
  Administered 2013-11-01: 50 ug/h via INTRAVENOUS
  Administered 2013-11-02 – 2013-11-03 (×2): 150 ug/h via INTRAVENOUS
  Filled 2013-11-01 (×4): qty 50

## 2013-11-01 MED ORDER — FENTANYL BOLUS VIA INFUSION
50.0000 ug | INTRAVENOUS | Status: DC | PRN
  Filled 2013-11-01: qty 100

## 2013-11-01 NOTE — Progress Notes (Signed)
CRITICAL VALUE ALERT  Critical value received: Hgb 6.7   Date of notification:  11/01/13  Time of notification:  0520  Critical value read back:yes  Nurse who received alert:  T. Joseph Art, RN  MD notified (1st page):  Audelia Hives MD   Time of first page:    MD notified (2nd page):  Time of second page:  Responding MD:  Deterding  Time MD responded:  [phone]

## 2013-11-01 NOTE — Progress Notes (Signed)
Chaplain offered emotional/spiritual support to pt's mother and sister-in-law who were visiting patient. RN confirmed contact information for pt's brother and son, who are making medical decisions, so that MD can contact them. Pt's sister-in-law said they are "hanging in there." The family has no needs at this time. Chaplain available for follow up.   Maurene Capes, [place] [phone]

## 2013-11-01 NOTE — Progress Notes (Signed)
NUTRITION FOLLOW UP  DOCUMENTATION CODES  Per approved criteria   -Severe malnutrition in the context of chronic illness    Intervention:    Continue Vital AF 1.2 at 35 ml/h to provide 1008 kcals, 63 gm protein, 681 ml free water daily.  Nutrition Dx:   Inadequate oral intake related to inability to eat as evidenced by NPO status. Ongoing.  Goal:   Intake to meet >90% of estimated nutrition needs. Met.  Monitor:   TF tolerance/adequacy, weight trend, labs, vent status.  Assessment:   67 yo with past medical history of COPD and CHF transferred from [facility] on 12/10 after being admitted x 2 weeks for acute respiratory failure. Intubated on 11/30, extubated on 12/08. Re intubated 12/09. Course was complicated by pneumothorax requiring chest tube placement.  Vital AF 1.2 currently infusing via OGT at 35 ml/h; tolerating well per RN with residuals </= 120 ml last night. Magnesium, phosphorus, potassium WNL over the weekend.  Patient is currently intubated on ventilator support.  MV: 7.2, 8, 4.7 L/min Temp (24hrs), Avg:98.1 F (36.7 C), Min:97.8 F (36.6 C), Max:98.6 F (37 C)  Propofol: 1.3 ml/hr providing 34 kcals per day. Propofol to be discontinued per RN due to low blood pressure and agitation.   Height: Ht Readings from Last 1 Encounters:  10/27/13 5\' 1"  (1.549 m)    Weight Status:   Wt Readings from Last 1 Encounters:  10/30/13 88 lb 6.5 oz (40.1 kg)  10/29/13  88 lb 10 oz (40.2 kg)  10/28/13 84 lb 7 oz (38.3 kg)   Re-estimated needs:  Kcal: 1050  Protein: 55-65 gm  Fluid: 1.1-1.2 L  Skin: no wounds  Diet Order: NPO   Intake/Output Summary (Last 24 hours) at 11/01/13 1028 Last data filed at 11/01/13 0600  Gross per 24 hour  Intake 2615.7 ml  Output   1075 ml  Net 1540.7 ml    Last BM: 12/13   Labs:   Recent Labs Lab 10/29/13 0450 10/30/13 0315 10/31/13 0500 11/01/13 0455  NA 139 141 141 141  K 3.6 3.7 3.6 3.4*  CL 105 109 110 110   CO2 25 24 25 23   BUN 13 14 15 18   CREATININE 1.19* 1.17* 1.11* 1.07  CALCIUM 7.8* 8.0* 7.7* 8.0*  MG 1.7 1.7 2.1  --   PHOS 2.4 2.9 3.1  --   GLUCOSE 101* 98 107* 103*    CBG (last 3)   Recent Labs  10/31/13 2333 11/01/13 0336 11/01/13 0827  GLUCAP 98 107* 99    Scheduled Meds: . antiseptic oral rinse  1 application Mouth Rinse QID  . arformoterol  15 mcg Nebulization BID  . budesonide  0.25 mg Nebulization Q6H  . chlorhexidine  15 mL Mouth/Throat BID  . clonazePAM  0.5 mg Oral BID  . famotidine  20 mg Per Tube Daily  . feeding supplement (VITAL AF 1.2 CAL)  1,000 mL Per Tube Q24H  . heparin subcutaneous  5,000 Units Subcutaneous Q8H  . potassium chloride  20 mEq Per Tube Q4H  . QUEtiapine  50 mg Oral BID    Continuous Infusions: . sodium chloride 40 mL/hr at 10/31/13 0914  . dexmedetomidine      Joaquin Courts, RD, LDN, CNSC Pager [phone] After Hours Pager [phone]

## 2013-11-01 NOTE — Progress Notes (Signed)
Patient ID: Brandy [name], female   DOB: Aug 24, 1946, 67 y.o.   MRN: [id number]      [address]       [place] [address]             [phone]                      LOS: 5 days   Subjective: Sedated but responsive, unable to wean vent  Objective: Vital signs in last 24 hours: Patient Vitals for the past 24 hrs:  BP Temp Temp src Pulse Resp SpO2  11/01/13 0829 - 98 F (36.7 C) Oral - - -  11/01/13 0730 109/52 mmHg - - 78 12 100 %  11/01/13 0600 98/62 mmHg - - 91 15 100 %  11/01/13 0500 91/67 mmHg - - 75 10 100 %  11/01/13 0404 - 97.8 F (36.6 C) Oral - - -  11/01/13 0400 113/52 mmHg - - 107 22 100 %  11/01/13 0341 - - - 111 20 100 %  11/01/13 0300 92/65 mmHg - - 98 22 97 %  11/01/13 0214 - - - 84 12 100 %  11/01/13 0200 69/51 mmHg - - 81 10 100 %  11/01/13 0100 91/69 mmHg - - 98 10 100 %  11/01/13 0007 - 98.6 F (37 C) Oral - - -  11/01/13 0000 90/55 mmHg - - 94 13 100 %  10/31/13 2341 - - - - 18 100 %  10/31/13 2300 115/57 mmHg - - 94 14 100 %  10/31/13 2200 99/58 mmHg - - 97 12 100 %  10/31/13 2100 111/60 mmHg - - 81 21 97 %  10/31/13 2000 75/47 mmHg - - 82 12 96 %  10/31/13 1956 - 97.9 F (36.6 C) Oral - - -  10/31/13 1935 75/46 mmHg - - - 20 -  10/31/13 1900 82/57 mmHg - - - 11 -  10/31/13 1845 67/37 mmHg - - - 10 -  10/31/13 1830 60/48 mmHg - - - 10 -  10/31/13 1815 66/52 mmHg - - - 10 -  10/31/13 1800 89/58 mmHg - - 99 10 100 %  10/31/13 1745 92/69 mmHg - - 95 15 100 %  10/31/13 1730 94/65 mmHg - - 97 13 100 %  10/31/13 1715 110/59 mmHg - - 108 17 100 %  10/31/13 1700 120/40 mmHg - - 101 24 100 %  10/31/13 1647 117/75 mmHg - - 104 20 100 %  10/31/13 1645 119/58 mmHg - - 102 21 100 %  10/31/13 1630 102/66 mmHg - - 95 21 97 %  10/31/13 1615 100/76 mmHg - - - 10 -  10/31/13 1600 85/60 mmHg - - - 10 100 %  10/31/13 1545 94/62 mmHg - - 102 13 100 %  10/31/13 1541 - 98.1 F (36.7 C) Oral - - -  10/31/13 1530 140/65 mmHg - - 130 26 100 %    10/31/13 1515 123/75 mmHg - - 130 25 100 %  10/31/13 1500 127/70 mmHg - - 116 20 100 %  10/31/13 1445 101/65 mmHg - - 101 29 100 %  10/31/13 1435 83/48 mmHg - - - 12 -  10/31/13 1430 76/48 mmHg - - - 11 -  10/31/13 1415 73/46 mmHg - - - 11 -  10/31/13 1400 78/48 mmHg - - - 11 -  10/31/13 1345 79/50 mmHg - - - 11 -  10/31/13 1330 84/47 mmHg - - -  11 -  10/31/13 1315 87/50 mmHg - - - 11 -  10/31/13 1300 88/48 mmHg - - 79 14 100 %  10/31/13 1253 77/40 mmHg - - 80 12 100 %  10/31/13 1245 73/40 mmHg - - 80 13 100 %  10/31/13 1230 74/40 mmHg - - 81 13 100 %  10/31/13 1215 86/47 mmHg - - - 13 -  10/31/13 1200 75/41 mmHg - - 89 15 100 %  10/31/13 1145 83/45 mmHg - - 89 13 100 %  10/31/13 1130 91/49 mmHg - - - 13 -  10/31/13 1126 - 98.3 F (36.8 C) Oral - - -  10/31/13 1115 87/47 mmHg - - - 12 -  10/31/13 1100 89/47 mmHg - - 90 12 100 %  10/31/13 1045 124/67 mmHg - - 103 21 100 %  10/31/13 1030 120/63 mmHg - - 103 22 100 %  10/31/13 1015 115/61 mmHg - - 95 22 100 %  10/31/13 1000 111/67 mmHg - - 98 23 100 %  10/31/13 0945 119/56 mmHg - - 95 16 100 %  10/31/13 0930 90/44 mmHg - - 83 12 100 %    Filed Weights   10/28/13 0200 10/29/13 0500 10/30/13 0500  Weight: 84 lb 7 oz (38.3 kg) 88 lb 10 oz (40.2 kg) 88 lb 6.5 oz (40.1 kg)    Hemodynamic parameters for last 24 hours: CVP:  [6 mmHg] 6 mmHg  Intake/Output from previous day: 12/14 0701 - 12/15 0700 In: 2949.2 [I.V.:984.2; NG/GT:965; IV Piggyback:1000] Out: 1300 [Urine:1000; Chest Tube:300] Intake/Output this shift:    Scheduled Meds: . antiseptic oral rinse  1 application Mouth Rinse QID  . arformoterol  15 mcg Nebulization BID  . budesonide  0.25 mg Nebulization Q6H  . chlorhexidine  15 mL Mouth/Throat BID  . clonazePAM  0.5 mg Oral BID  . famotidine  20 mg Per Tube Daily  . feeding supplement (VITAL AF 1.2 CAL)  1,000 mL Per Tube Q24H  . heparin subcutaneous  5,000 Units Subcutaneous Q8H  . potassium chloride  20 mEq  Per Tube Q4H  . QUEtiapine  50 mg Oral BID   Continuous Infusions: . sodium chloride 40 mL/hr at 10/31/13 0914  . propofol 10 mcg/kg/min (11/01/13 0215)   PRN Meds:.albuterol, fentaNYL, ipratropium  General appearance: cachectic, mild distress and uncooperative Neurologic: intact Heart: regular rate and rhythm, S1, S2 normal, no murmur, click, rub or gallop Lungs: diminished breath sounds bilaterally Abdomen: soft, non-tender; bowel sounds normal; no masses,  no organomegaly Extremities: extremities normal, atraumatic, no cyanosis or edema Wound: still small air leak fro ct tube  Lab Results: CBC: Recent Labs  10/31/13 0500 11/01/13 0455  WBC 10.7* 10.0  HGB 7.4* 6.7*  HCT 23.0* 20.6*  PLT 233 217   BMET:  Recent Labs  10/31/13 0500 11/01/13 0455  NA 141 141  K 3.6 3.4*  CL 110 110  CO2 25 23  GLUCOSE 107* 103*  BUN 15 18  CREATININE 1.11* 1.07  CALCIUM 7.7* 8.0*    PT/INR: No results found for this basename: LABPROT, INR,  in the last 72 hours   Radiology Ct Chest Wo Contrast  10/31/2013   CLINICAL DATA:  Chronic respiratory failure, ventilator dependent currently, failed extubation twice during this admission, with enlarging right pneumothorax despite chest tube placement.  EXAM: CT CHEST WITHOUT CONTRAST  TECHNIQUE: Multidetector CT imaging of the chest was performed following the standard protocol without IV contrast.  COMPARISON:  Multiple recent portable chest  x-rays. CTA chest 10/17/2013 and CT chest 08/31/2004.  FINDINGS: Right pneumothorax on the order of 25-30% or so, with right chest tube in place. The pneumothorax is loculated, as the lung is tethered to the pleura in multiple locations due to scarring. There is no residual pleural air at the site of the chest tube. The largest component of the residual pneumothorax is at the base of the right he me thorax. There is no left pneumothorax.  Severe bullous emphysematous changes are again demonstrated  throughout both lungs. Bilateral pleural effusions, left greater than right. Dense consolidation with air bronchograms in the left lower lobe. Mild passive atelectasis in the right lower lobe. Focal areas of dense atelectasis in the right upper lobe in the perihilar region due to the collapsed lung. 9 mm nodule in the lateral right lower lobe (series 4, image 35), stable dating back to the 2005 examination. No new pulmonary parenchymal nodules or masses. Central airways patent with moderate bronchial wall thickening. Endotracheal tube tip appropriately positioned approximately 4 cm above the carina. Moderate amount of subcutaneous emphysema in the right chest wall extending into the right supraclavicular region.  Heart size normal. No pericardial effusion. Right subclavian central venous catheter tip in the lower SVC near the cavoatrial junction. Mild to moderate atherosclerosis involving the thoracic and upper abdominal aorta without aneurysm. Aortic annular calcification. No visible coronary artery calcification.  No significant mediastinal, hilar, or axillary lymphadenopathy. Visualized thyroid gland atrophic but otherwise unremarkable. Diffuse body wall edema.  Visualized upper abdomen unremarkable for the unenhanced technique. Nasogastric tube in the stomach. Bone window images demonstrate generalized osseous demineralization, moderate diffuse thoracic spondylosis, DISH, and exaggeration of the usual thoracic kyphosis.  IMPRESSION: 1. Right pneumothorax on the order of 25-30% or so, loculated as the lung it is tethered to the pleural surface in multiple locations by scar. There is no residual pleural air at the site of the chest tube. The largest residual loculation of pleural air is at the base of the right hemithorax, and if a 2nd chest tube is considered it should be placed in this area. 2. Bilateral pleural effusions, left greater than right. 3. Left lower lobe pneumonia. 4. Passive atelectasis in the right  lower lobe and in the central right upper lobe. 5. Benign 9 mm nodule in the lateral right lower lobe stable 2005. 6. Support apparatus satisfactory.   Electronically Signed   By: Hulan Saas M.D.   On: 10/31/2013 08:00   Dg Chest Port 1 View  11/01/2013   CLINICAL DATA:  Pneumothorax.  Shortness of breath.  EXAM: PORTABLE CHEST - 1 VIEW  COMPARISON:  Chest x-ray and CT chest 10/30/2013.  FINDINGS: The endotracheal tube is stable, 2 cm above the carinal. The NG tube courses off the inferior border of the film. A right subclavian line is stable in position. The patient is slightly rotated to the left. A right-sided hydro pneumothorax persists with the chest tube in place. The pleural air is slightly decreased compared to the prior chest radiograph. Bilateral pleural effusions persist, left greater than right.  IMPRESSION: 1. Slight decrease in the pleural air associated with a right-sided hydro pneumothorax. 2. The support apparatus is stable. 3. Persistent bilateral pleural fluid, left greater than right.   Electronically Signed   By: Gennette Pac M.D.   On: 11/01/2013 07:12     Assessment/Plan: Progressive anemia, hgb now 6.7/20.6 Decrease in rt basal ptx with chest tube in place Leave current CT in place on -  30 mm suction  Delight Ovens MD 11/01/2013 9:21 AM

## 2013-11-01 NOTE — Progress Notes (Signed)
Wean off ventilator attempted. Pt had maintained apnea on PS 10-15. Pt placed back on full support and RT will continue to monitor.

## 2013-11-01 NOTE — Progress Notes (Signed)
PULMONARY  / CRITICAL CARE MEDICINE  Name: Brandy [name] MRN: [id number] DOB: 28-Dec-1945    ADMISSION DATE:  10/27/2013 CONSULTATION DATE:  10/27/2013  REFERRING MD :  Maryruth Bun ICU PRIMARY SERVICE:  PCCM  CHIEF COMPLAINT:  Acute respiratory failure  BRIEF PATIENT DESCRIPTION: 67 yo with past medical history of COPD and CHF transferred from [facility] after being admitted x 2 weeks for acute respiratory failure.  Extubated 12/08.  Re intubated 12/09.  Course was complicated by pneumothorax requiring chest tube placement.  SIGNIFICANT EVENTS / STUDIES:  11/30  Admitted to [facility] with acute respiratory failure, failed BiPAP, intubated 12/08  Extubated 12/09  Re intubated 12/09  Pneumothorax / R chest tube placed 12/10 Transferred to [facility] 12/13 CT chest>>> R ptx 25-30%, lung tethered to pleural surface in multiple locations, R>L effusion, LLL PNA, stable RLL nodule ; TCTS consulted  LINES / TUBES: ETT 12/09 >> 12/12, 12/12>>> R [place] CVL 12/09 >>  R chest tube 12/09 >>   CULTURES:  ANTIBIOTICS:  INTERVAL HISTORY: Agitation, propofol, causing some low BP  VITAL SIGNS: Temp:  [97.8 F (36.6 C)-98.6 F (37 C)] 97.8 F (36.6 C) (12/15 0930) Pulse Rate:  [75-130] 95 (12/15 0930) Resp:  [10-29] 12 (12/15 0730) BP: (60-140)/(37-76) 88/60 mmHg (12/15 0930) SpO2:  [96 %-100 %] 100 % (12/15 0930) FiO2 (%):  [40 %] 40 % (12/15 0730) HEMODYNAMICS: CVP:  [6 mmHg] 6 mmHg VENTILATOR SETTINGS: Vent Mode:  [-] PRVC FiO2 (%):  [40 %] 40 % Set Rate:  [12 bmp] 12 bmp Vt Set:  [400 mL] 400 mL PEEP:  [5 cmH20] 5 cmH20 Pressure Support:  [10 cmH20] 10 cmH20 Plateau Pressure:  [17 cmH20-32 cmH20] 28 cmH20  INTAKE / OUTPUT: Intake/Output     12/14 0701 - 12/15 0700 12/15 0701 - 12/16 0700   I.V. (mL/kg) 984.2 (24.5)    NG/GT 965    IV Piggyback 1000    Total Intake(mL/kg) 2949.2 (73.5)    Urine (mL/kg/hr) 1000 (1)    Chest Tube 300 (0.3)    Total Output 1300     Net +1649.2             PHYSICAL EXAMINATION: General: frail, thin, chronically ill appearing female, NAD  Neuro: agitation, rass plus 2 HEENT: ETT, mm dry  Cardiovascular: RRR s M Lungs: reduced, persistent air leak R CT Abdomen:  Soft, nontender, +BS Ext: no edema  LABS:  Recent Labs Lab 10/30/13 0315 10/31/13 0500 11/01/13 0455  HGB 7.8* 7.4* 6.7*  HCT 23.4* 23.0* 20.6*  WBC 8.7 10.7* 10.0  PLT 185 233 217    Recent Labs Lab 10/27/13 1558 10/28/13 0432 10/29/13 0450 10/30/13 0315 10/31/13 0500 11/01/13 0455  NA 136 138 139 141 141 141  K 2.9* 3.5 3.6 3.7 3.6 3.4*  CL 98 102 105 109 110 110  CO2 32 26 25 24 25 23   GLUCOSE 87 83 101* 98 107* 103*  BUN 12 13 13 14 15 18   CREATININE 1.07 1.18* 1.19* 1.17* 1.11* 1.07  CALCIUM 7.7* 7.4* 7.8* 8.0* 7.7* 8.0*  MG 1.3*  --  1.7 1.7 2.1  --   PHOS 1.3*  --  2.4 2.9 3.1  --     Recent Labs Lab 10/27/13 1558  AST 29  ALT 28  ALKPHOS 59  BILITOT 0.7  PROT 4.3*  ALBUMIN 2.2*  INR 1.26     CXR: R ptx, hyperinflation, ett wnl   ASSESSMENT / PLAN:  PULMONARY  A:  Acute on chronic respiratory failure - failed extubation x 2  AECOPD.  R PTX with persistent air leak P:   Appreciate CVTS help, to remain on suction pcxr in am  Weaning this am cpa p5 ps 5, PS increase if needed Will discuss trach with family today, likely for tues  CARDIOVASCULAR A: Borderline BP P:  Monitor tele See neuro for sedation chanages  RENAL A:  Hypokalemia P:   bmet in am  k supp  GASTROINTESTINAL A:  Severe protein calorie malnutrition Risk of refeeding syndrome no longer P:   SUP: famotidine Continue TF    HEMATOLOGIC A:  Anemia without acute blood loss P:  DVT px: sq heparin Monitor CBC prbc  1 , then cbc  INFECTIOUS A:  No definite acute infection P:   Follow fever curve    ENDOCRINE  A: no active issue  P:   Monitor glu on chem panels Begin SSI for glu > 180  NEUROLOGIC A:  Acute encephalopathy History of  alcohol abuse P:   Dc propofol, low BP ??multiple psych meds at home per family - none listed Consider precedex PRN fentanyl  CC time 30 minutes.  Mcarthur Rossetti. Tyson Alias, MD, FACP Pgr: [phone] [place] [facility]

## 2013-11-01 NOTE — Progress Notes (Signed)
eLink Physician-Brief Progress Note Patient Name: Brandy [name] DOB: 1946-10-30 MRN: [id number]  Date of Service  11/01/2013   HPI/Events of Note  Hgb 6.7 down from 7.4   eICU Interventions  Plan: Transfuse 1 u pRBC Post-transfusion CBC   Intervention Category Intermediate Interventions: Bleeding - evaluation and treatment with blood products  [name] 11/01/2013, 5:23 AM

## 2013-11-01 NOTE — Progress Notes (Signed)
[facility] ADULT ICU REPLACEMENT PROTOCOL FOR AM LAB REPLACEMENT ONLY  The patient does apply for the [facility] Adult ICU Electrolyte Replacment Protocol based on the criteria listed below:   1. Is GFR >/= 40 ml/min? yes  Patient's GFR today is 60 2. Is urine output >/= 0.5 ml/kg/hr for the last 6 hours? yes Patient's UOP is .99 ml/kg/hr 3. Is BUN < 60 mg/dL? yes  Patient's BUN today is 18 4. Abnormal electrolyte(s)K 3.4 5. Ordered repletion with: per protocol 6. If a panic level lab has been reported, has the CCM MD in charge been notified? yes.   Physician:  Rollene Rotunda 11/01/2013 6:19 AM

## 2013-11-02 ENCOUNTER — Inpatient Hospital Stay (HOSPITAL_COMMUNITY): Payer: Medicare Other

## 2013-11-02 ENCOUNTER — Encounter (HOSPITAL_COMMUNITY): Payer: Self-pay | Admitting: Internal Medicine

## 2013-11-02 DIAGNOSIS — J930 Spontaneous tension pneumothorax: Secondary | ICD-10-CM

## 2013-11-02 LAB — CBC
HCT: 24.4 % — ABNORMAL LOW (ref 36.0–46.0)
Hemoglobin: 8.1 g/dL — ABNORMAL LOW (ref 12.0–15.0)
MCHC: 33.2 g/dL (ref 30.0–36.0)
MCV: 84.7 fL (ref 78.0–100.0)
RDW: 16.7 % — ABNORMAL HIGH (ref 11.5–15.5)
WBC: 8.2 10*3/uL (ref 4.0–10.5)

## 2013-11-02 LAB — TYPE AND SCREEN
ABO/RH(D): B POS
Antibody Screen: NEGATIVE
Unit division: 0

## 2013-11-02 LAB — GLUCOSE, CAPILLARY
Glucose-Capillary: 109 mg/dL — ABNORMAL HIGH (ref 70–99)
Glucose-Capillary: 132 mg/dL — ABNORMAL HIGH (ref 70–99)
Glucose-Capillary: 67 mg/dL — ABNORMAL LOW (ref 70–99)
Glucose-Capillary: 70 mg/dL (ref 70–99)

## 2013-11-02 LAB — BASIC METABOLIC PANEL
BUN: 21 mg/dL (ref 6–23)
Calcium: 8.1 mg/dL — ABNORMAL LOW (ref 8.4–10.5)
Chloride: 111 mEq/L (ref 96–112)
Creatinine, Ser: 1.01 mg/dL (ref 0.50–1.10)
GFR calc Af Amer: 65 mL/min — ABNORMAL LOW (ref >90)
GFR calc non Af Amer: 56 mL/min — ABNORMAL LOW (ref >90)
Glucose, Bld: 76 mg/dL (ref 70–99)
Potassium: 4 mEq/L (ref 3.5–5.1)

## 2013-11-02 LAB — APTT: aPTT: 28 seconds (ref 24–37)

## 2013-11-02 MED ORDER — DEXTROSE 50 % IV SOLN: 25.0000 mL | Freq: Once | INTRAVENOUS | Status: AC | PRN

## 2013-11-02 MED ORDER — DEXTROSE 50 % IV SOLN
INTRAVENOUS | Status: AC
  Administered 2013-11-02: 50 mL
  Filled 2013-11-02: qty 50

## 2013-11-02 MED ORDER — NOREPINEPHRINE BITARTRATE 1 MG/ML IJ SOLN
2.0000 ug/min | INTRAMUSCULAR | Status: DC
  Administered 2013-11-02: 5 ug/min via INTRAVENOUS
  Administered 2013-11-03: 2 ug/min via INTRAVENOUS
  Filled 2013-11-02: qty 4

## 2013-11-02 MED ORDER — FENTANYL CITRATE 0.05 MG/ML IJ SOLN: 25.0000 ug | INTRAMUSCULAR | Status: AC | PRN

## 2013-11-02 MED ORDER — MIDAZOLAM HCL 2 MG/2ML IJ SOLN
INTRAMUSCULAR | Status: AC
  Filled 2013-11-02: qty 6

## 2013-11-02 MED ORDER — DEXTROSE 5 % IV SOLN
INTRAVENOUS | Status: DC
  Administered 2013-11-02 – 2013-11-11 (×8): via INTRAVENOUS

## 2013-11-02 MED ORDER — SODIUM CHLORIDE 0.9 % IV BOLUS (SEPSIS)
500.0000 mL | Freq: Once | INTRAVENOUS | Status: AC
  Administered 2013-11-02: 500 mL via INTRAVENOUS

## 2013-11-02 MED ORDER — ETOMIDATE 2 MG/ML IV SOLN
INTRAVENOUS | Status: AC
  Filled 2013-11-02: qty 10

## 2013-11-02 NOTE — Progress Notes (Signed)
eLink Physician-Brief Progress Note Patient Name: Brandy [name] Nin DOB: 1946/07/16 MRN: [id number]  Date of Service  11/02/2013   HPI/Events of Note  Restraints expired.    eICU Interventions  Renewed for 16 hours.      YACOUB,WESAM 11/02/2013, 3:34 PM

## 2013-11-02 NOTE — Progress Notes (Signed)
PULMONARY  / CRITICAL CARE MEDICINE  Name: Brandy [name] MRN: [id number] DOB: November 20, 1945    ADMISSION DATE:  10/27/2013 CONSULTATION DATE:  10/27/2013  REFERRING MD :  Maryruth Bun ICU PRIMARY SERVICE:  PCCM  CHIEF COMPLAINT:  Acute respiratory failure  BRIEF PATIENT DESCRIPTION: 67 yo with past medical history of COPD and CHF transferred from [facility] after being admitted x 2 weeks for acute respiratory failure.  Extubated 12/08.  Re intubated 12/09.  Course was complicated by pneumothorax requiring chest tube placement.  SIGNIFICANT EVENTS / STUDIES:  11/30  Admitted to [facility] with acute respiratory failure, failed BiPAP, intubated 12/08  Extubated 12/09  Re intubated 12/09  Pneumothorax / R chest tube placed 12/10 Transferred to [facility] 12/13 CT chest>>> R ptx 25-30%, lung tethered to pleural surface in multiple locations, R>L effusion, LLL PNA, stable RLL nodule ; TCTS consulted 12/15 borderline BP  LINES / TUBES: ETT 12/09 >> 12/12, 12/12>>> R [place] CVL 12/09 >>  R chest tube 12/09 >>   CULTURES:  ANTIBIOTICS:  INTERVAL HISTORY: Low MAP at times, no leak noted on CT  VITAL SIGNS: Temp:  [97.5 F (36.4 C)-98.5 F (36.9 C)] 98.1 F (36.7 C) (12/16 0433) Pulse Rate:  [63-117] 107 (12/16 0722) Resp:  [9-20] 15 (12/16 0722) BP: (67-141)/(36-76) 137/63 mmHg (12/16 0722) SpO2:  [99 %-100 %] 100 % (12/16 0722) FiO2 (%):  [40 %] 40 % (12/16 0722) HEMODYNAMICS: CVP:  [7 mmHg] 7 mmHg VENTILATOR SETTINGS: Vent Mode:  [-] PRVC FiO2 (%):  [40 %] 40 % Set Rate:  [12 bmp] 12 bmp Vt Set:  [400 mL] 400 mL PEEP:  [5 cmH20] 5 cmH20 Plateau Pressure:  [16 cmH20-28 cmH20] 23 cmH20  INTAKE / OUTPUT: Intake/Output     12/15 0701 - 12/16 0700 12/16 0701 - 12/17 0700   I.V. (mL/kg) 865.3 (21.6)    Blood 400    NG/GT 620    IV Piggyback     Total Intake(mL/kg) 1885.3 (47)    Urine (mL/kg/hr) 495 (0.5)    Chest Tube     Total Output 495     Net +1390.3             PHYSICAL EXAMINATION: General: no distress Neuro: rass , int simple commands HEENT: ETT, mm dry  Cardiovascular: RRR s M Lungs: reduced throughout but clear anterior Abdomen:  Soft, nontender, +BS Ext: no edema  LABS:  Recent Labs Lab 11/01/13 0455 11/01/13 1500 11/02/13 0400  HGB 6.7* 7.7* 8.1*  HCT 20.6* 23.0* 24.4*  WBC 10.0 8.9 8.2  PLT 217 201 207    Recent Labs Lab 10/27/13 1558  10/29/13 0450 10/30/13 0315 10/31/13 0500 11/01/13 0455 11/02/13 0400  NA 136  < > 139 141 141 141 141  K 2.9*  < > 3.6 3.7 3.6 3.4* 4.0  CL 98  < > 105 109 110 110 111  CO2 32  < > 25 24 25 23 25   GLUCOSE 87  < > 101* 98 107* 103* 76  BUN 12  < > 13 14 15 18 21   CREATININE 1.07  < > 1.19* 1.17* 1.11* 1.07 1.01  CALCIUM 7.7*  < > 7.8* 8.0* 7.7* 8.0* 8.1*  MG 1.3*  --  1.7 1.7 2.1  --   --   PHOS 1.3*  --  2.4 2.9 3.1  --   --   < > = values in this interval not displayed.  Recent Labs Lab 10/27/13 1558 11/02/13 0400  AST 29  --   ALT 28  --   ALKPHOS 59  --   BILITOT 0.7  --   PROT 4.3*  --   ALBUMIN 2.2*  --   INR 1.26 1.05    CXR: pending   ASSESSMENT / PLAN:  PULMONARY A:  Acute on chronic respiratory failure - failed extubation x 2  AECOPD.  R PTX with persistent air leak, improved 12/16 P:   Chest tube per cvts, no leak noted thi sam  pcxr now to order,and for am Wean attempts cpap 5 ps 8-10 Trach this am planned, family updated Even balance goals  CARDIOVASCULAR A: Borderline BP P:  Consider cvp Bolus attempt Precedex and propofol off May need levophed, likely sedation affect  RENAL A:  Hypokalemia resolved P:   bmet in am  Bolus x 1  GASTROINTESTINAL A:  Severe protein calorie malnutrition Risk of refeeding syndrome no longer P:   SUP: famotidine Continue TF , held for trach  HEMATOLOGIC A:  Anemia without acute blood loss P:  DVT px: sq heparin coags and plat wnl for trach  INFECTIOUS A:  No definite acute infection P:    Follow fever curve , if spike add nosocomial hcap coverage  ENDOCRINE  A: Hypoglycemia frmo TF held P:   Monitor glu on chem panels ssi hold d5 in fluids  NEUROLOGIC A:  Acute encephalopathy History of alcohol abuse P:   fent drip  Extensive family update 12/15, for wean, trach, bolus  CC time 30 minutes.  Mcarthur Rossetti. Tyson Alias, MD, FACP Pgr: [phone] [place] [facility]

## 2013-11-02 NOTE — Progress Notes (Signed)
Brief Note:  Feeding tube placed in right nare into the stomach using Cortrak EAS 2. Pt tolerated procedure well. Xray to follow.   Kendell Bane RD, LDN, CNSC [phone] Pager [phone] After Hours Pager

## 2013-11-02 NOTE — Procedures (Signed)
Perc trach 6 shiley placed Blood loss less 1 cc Tolerated well  [pcxr pedning See full dictation  Mcarthur Rossetti. Tyson Alias, MD, FACP Pgr: [phone] [place] [facility]

## 2013-11-02 NOTE — Progress Notes (Signed)
eLink Physician-Brief Progress Note Patient Name: Randye Treichler Buesing DOB: [birth year] MRN: [id number]  Date of Service  11/02/2013   HPI/Events of Note   Asked to evaluate gastric tube position.  eICU Interventions  May use gastric tube.      Tramayne Sebesta 11/02/2013, 3:37 PM

## 2013-11-02 NOTE — Procedures (Signed)
Name:  Sobia Karger Powell MRN:  [id number] DOB:  Jul 03, 1946  PROCEDURE NOTE  Procedure:  Flexible bronchoscopy (40981)  Indications:  Percutaneous tracheostomy  Consent:  Obtained from medical POA.  Anesthesia:  Already sedated for mechanical ventilation.   Procedure summary:  Appropriate equipment was assembled.  The patient was identified as Brandy [name].  Safety timeout was performed. The patient was placed supine and adequate level of sedation was assured.  Flexible bronchoscope was lubricated and inserted via the endotracheal tube.  Airway exam was normal. Large amount of thick secretions was aspirated.  Placement of percutaneous tracheostomy was monitored and no complications noted.  Tracheostomy tube position was bronchoscopically confirmed.  Specimens sent: None  Complications:  No immediate complications were noted.  Hemodynamic parameters and oxygenation remained stable throughout the procedure.  Estimated blood loss:  Less then 5 mL.  Orlean Bradford, M.D. Pulmonary and Critical Care Medicine [facility] Pager: [phone]  11/02/2013, 2:07 PM

## 2013-11-02 NOTE — Progress Notes (Addendum)
TCTS DAILY ICU PROGRESS NOTE                   [address]            [place] [address]          [phone]        Total Length of Stay:  LOS: 6 days   Subjective: Remains on vent.  No acute changes.   Objective: Vital signs in last 24 hours: Temp:  [97.5 F (36.4 C)-98.5 F (36.9 C)] 98.4 F (36.9 C) (12/16 0742) Pulse Rate:  [63-117] 107 (12/16 0722) Cardiac Rhythm:  [-] Normal sinus rhythm (12/16 0600) Resp:  [9-20] 15 (12/16 0722) BP: (67-141)/(36-76) 137/63 mmHg (12/16 0722) SpO2:  [99 %-100 %] 100 % (12/16 0722) FiO2 (%):  [40 %] 40 % (12/16 0722)  Filed Weights   10/28/13 0200 10/29/13 0500 10/30/13 0500  Weight: 84 lb 7 oz (38.3 kg) 88 lb 10 oz (40.2 kg) 88 lb 6.5 oz (40.1 kg)    Weight change:    Hemodynamic parameters for last 24 hours: CVP:  [7 mmHg] 7 mmHg  Intake/Output from previous day: 12/15 0701 - 12/16 0700 In: 1885.3 [I.V.:865.3; Blood:400; NG/GT:620] Out: 495 [Urine:495]  Intake/Output this shift:    Current Meds: Scheduled Meds: . antiseptic oral rinse  1 application Mouth Rinse QID  . arformoterol  15 mcg Nebulization BID  . budesonide  0.25 mg Nebulization Q6H  . chlorhexidine  15 mL Mouth/Throat BID  . clonazePAM  0.5 mg Oral BID  . etomidate  40 mg Intravenous Once  . famotidine  20 mg Per Tube Daily  . feeding supplement (VITAL AF 1.2 CAL)  1,000 mL Per Tube Q24H  . fentaNYL  200 mcg Intravenous Once  . fentaNYL  50 mcg Intravenous Once  . heparin subcutaneous  5,000 Units Subcutaneous Q8H  . midazolam  4 mg Intravenous Once  . QUEtiapine  50 mg Oral BID  . sodium chloride  500 mL Intravenous Once  . vecuronium  10 mg Intravenous Once   Continuous Infusions: . dextrose 20 mL/hr at 11/02/13 0105  . fentaNYL infusion INTRAVENOUS 50 mcg/hr (11/01/13 2000)   PRN Meds:.albuterol, dextrose, fentaNYL, ipratropium, midazolam   Physical Exam: General appearance: No acute distress Heart: Regular rate and  rhythm Lungs: Few coarse BS, diminished overall bilaterally Chest tube: No air leak noted    Lab Results: CBC: Recent Labs  11/01/13 1500 11/02/13 0400  WBC 8.9 8.2  HGB 7.7* 8.1*  HCT 23.0* 24.4*  PLT 201 207   BMET:  Recent Labs  11/01/13 0455 11/02/13 0400  NA 141 141  K 3.4* 4.0  CL 110 111  CO2 23 25  GLUCOSE 103* 76  BUN 18 21  CREATININE 1.07 1.01  CALCIUM 8.0* 8.1*    PT/INR:  Recent Labs  11/02/13 0400  LABPROT 13.5  INR 1.05   Radiology: Dg Chest Port 1 View  11/01/2013   CLINICAL DATA:  Pneumothorax.  Shortness of breath.  EXAM: PORTABLE CHEST - 1 VIEW  COMPARISON:  Chest x-ray and CT chest 10/30/2013.  FINDINGS: The endotracheal tube is stable, 2 cm above the carinal. The NG tube courses off the inferior border of the film. A right subclavian line is stable in position. The patient is slightly rotated to the left. A right-sided hydro pneumothorax persists with the chest tube in place. The pleural air is slightly decreased compared to the prior chest radiograph. Bilateral  pleural effusions persist, left greater than right.  IMPRESSION: 1. Slight decrease in the pleural air associated with a right-sided hydro pneumothorax. 2. The support apparatus is stable. 3. Persistent bilateral pleural fluid, left greater than right.   Electronically Signed   By: Gennette Pac M.D.   On: 11/01/2013 07:12     Assessment/Plan: CXR this am shows stable R pneumothorax.  CT with no obvious air leak.  Continue CT to high suction for now. For trach today per CCM.     COLLINS,GINA H 11/02/2013 8:21 AM  I have seen and examined Radley J Allnutt and agree with the above assessment  and plan.  Delight Ovens MD Beeper [phone] Office [phone] 11/02/2013 1:43 PM

## 2013-11-03 ENCOUNTER — Inpatient Hospital Stay (HOSPITAL_COMMUNITY): Payer: Medicare Other

## 2013-11-03 ENCOUNTER — Encounter (HOSPITAL_COMMUNITY): Payer: Self-pay | Admitting: Pulmonary Disease

## 2013-11-03 DIAGNOSIS — J930 Spontaneous tension pneumothorax: Secondary | ICD-10-CM

## 2013-11-03 LAB — CBC WITH DIFFERENTIAL/PLATELET
Basophils Relative: 0 % (ref 0–1)
Eosinophils Absolute: 0.2 10*3/uL (ref 0.0–0.7)
HCT: 25.5 % — ABNORMAL LOW (ref 36.0–46.0)
Hemoglobin: 8.6 g/dL — ABNORMAL LOW (ref 12.0–15.0)
MCH: 28.3 pg (ref 26.0–34.0)
MCHC: 33.7 g/dL (ref 30.0–36.0)
Monocytes Absolute: 1.1 10*3/uL — ABNORMAL HIGH (ref 0.1–1.0)
Monocytes Relative: 11 % (ref 3–12)
Neutrophils Relative %: 78 % — ABNORMAL HIGH (ref 43–77)
Platelets: 240 10*3/uL (ref 150–400)

## 2013-11-03 LAB — GLUCOSE, CAPILLARY
Glucose-Capillary: 116 mg/dL — ABNORMAL HIGH (ref 70–99)
Glucose-Capillary: 95 mg/dL (ref 70–99)

## 2013-11-03 LAB — BASIC METABOLIC PANEL
BUN: 19 mg/dL (ref 6–23)
Chloride: 109 mEq/L (ref 96–112)
GFR calc non Af Amer: 55 mL/min — ABNORMAL LOW (ref >90)
Glucose, Bld: 109 mg/dL — ABNORMAL HIGH (ref 70–99)
Potassium: 4 mEq/L (ref 3.5–5.1)

## 2013-11-03 MED ORDER — NOREPINEPHRINE BITARTRATE 1 MG/ML IJ SOLN
2.0000 ug/min | INTRAVENOUS | Status: DC
  Filled 2013-11-03 (×2): qty 4

## 2013-11-03 MED ORDER — RISPERIDONE 1 MG/ML PO SOLN
1.0000 mg | Freq: Two times a day (BID) | ORAL | Status: DC
  Administered 2013-11-03 – 2013-11-07 (×9): 1 mg via ORAL
  Filled 2013-11-03 (×12): qty 1

## 2013-11-03 MED ORDER — SODIUM CHLORIDE 0.9 % IV BOLUS (SEPSIS)
500.0000 mL | Freq: Once | INTRAVENOUS | Status: AC
  Administered 2013-11-03: 500 mL via INTRAVENOUS

## 2013-11-03 NOTE — Progress Notes (Signed)
UR Completed.  Brandy [name] Jane [phone] 11/03/2013  

## 2013-11-03 NOTE — Progress Notes (Addendum)
TCTS DAILY ICU PROGRESS NOTE                   [address]            [place] [address]          [phone]        Total Length of Stay:  LOS: 7 days   Subjective: Eyes open on vent.     Objective: Vital signs in last 24 hours: Temp:  [97.4 F (36.3 C)-99.4 F (37.4 C)] 98.5 F (36.9 C) (12/17 0800) Pulse Rate:  [57-117] 70 (12/17 0715) Cardiac Rhythm:  [-] Normal sinus rhythm (12/17 0800) Resp:  [10-23] 12 (12/17 0800) BP: (80-154)/(40-86) 84/54 mmHg (12/17 0715) SpO2:  [97 %-100 %] 100 % (12/17 0842) FiO2 (%):  [40 %] 40 % (12/17 0850)  Filed Weights   10/28/13 0200 10/29/13 0500 10/30/13 0500  Weight: 84 lb 7 oz (38.3 kg) 88 lb 10 oz (40.2 kg) 88 lb 6.5 oz (40.1 kg)    Weight change:    Hemodynamic parameters for last 24 hours:    Intake/Output from previous day: 12/16 0701 - 12/17 0700 In: 1179.1 [I.V.:969.1; NG/GT:210] Out: 1060 [Urine:985; Chest Tube:75]  Intake/Output this shift: Total I/O In: 107.5 [I.V.:42.5; NG/GT:65] Out: 175 [Urine:175]  Current Meds: Scheduled Meds: . antiseptic oral rinse  1 application Mouth Rinse QID  . arformoterol  15 mcg Nebulization BID  . budesonide  0.25 mg Nebulization Q6H  . chlorhexidine  15 mL Mouth/Throat BID  . clonazePAM  0.5 mg Oral BID  . etomidate  40 mg Intravenous Once  . famotidine  20 mg Per Tube Daily  . feeding supplement (VITAL AF 1.2 CAL)  1,000 mL Per Tube Q24H  . fentaNYL  50 mcg Intravenous Once  . heparin subcutaneous  5,000 Units Subcutaneous Q8H  . QUEtiapine  50 mg Oral BID  . vecuronium  10 mg Intravenous Once   Continuous Infusions: . dextrose 20 mL/hr at 11/02/13 0105  . fentaNYL infusion INTRAVENOUS 150 mcg/hr (11/03/13 0400)  . norepinephrine (LEVOPHED) Adult infusion 2 mcg/min (11/03/13 0400)   PRN Meds:.albuterol, fentaNYL, ipratropium, midazolam   Physical Exam: Heart: RRR Lungs: Few coarse BS, overall clear Chest tube: No air leak noted   Lab  Results: CBC: Recent Labs  11/02/13 0400 11/03/13 0410  WBC 8.2 9.6  HGB 8.1* 8.6*  HCT 24.4* 25.5*  PLT 207 240   BMET:  Recent Labs  11/02/13 0400 11/03/13 0410  NA 141 139  K 4.0 4.0  CL 111 109  CO2 25 25  GLUCOSE 76 109*  BUN 21 19  CREATININE 1.01 1.02  CALCIUM 8.1* 8.1*    PT/INR:  Recent Labs  11/02/13 0400  LABPROT 13.5  INR 1.05   Radiology: Dg Chest Port 1 View  11/03/2013   CLINICAL DATA:  Pneumothorax  EXAM: PORTABLE CHEST - 1 VIEW  COMPARISON:  November 02, 2013  FINDINGS: Tracheostomy is well seated. Central catheter tip is at the cavoatrial junction. Feeding tube extends below the diaphragm. Chest tube remains in the right. There is a minimal right apical pneumothorax, slightly smaller than on 1 day previous.  There are bilateral effusions with interstitial edema. There is consolidation in the medial left base, stable. Heart is upper normal in size with pulmonary venous hypertension.  IMPRESSION: Minimal right apical pneumothorax, slightly smaller than on study 1 day prior. Right chest tube remains in position. Other tubes and catheters as described.  Evidence of congestive heart failure. Question superimposed pneumonia medial left base.   Electronically Signed   By: Bretta Bang M.D.   On: 11/03/2013 07:11   Chest Portable 1 View To Assess Tube Placement And Rule-out Pneumothorax  11/02/2013   CLINICAL DATA:  Tracheostomy placement.  EXAM: PORTABLE CHEST - 1 VIEW  COMPARISON:  11/02/2013  FINDINGS: There is a tracheostomy tube present with the tip 9 cm above the carina in satisfactory position. There is a right-sided subclavian central venous catheter with the tip projecting over the SVC. There is a right-sided chest 2 in unchanged position. There is a trace right apical pneumothorax. There is a small left pleural effusion. There is no focal consolidation. Stable cardiomediastinal silhouette.  IMPRESSION: Interval tracheostomy tube placement.  Right-sided  chest tube with a trace right pneumothorax.   Electronically Signed   By: Elige Ko   On: 11/02/2013 09:30   Dg Chest Port 1 View  11/02/2013   CLINICAL DATA:  History right-sided pneumothorax.  EXAM: PORTABLE CHEST - 1 VIEW  COMPARISON:  11/01/2013  FINDINGS: The lung bases are not completely included on the study. An endotracheal tube is appreciated with tip 3.5 cm above the carina. NG tube is seen with tip not of the via study. A right-sided central venous catheters appreciated via right subclavian approach with tip projected regions superior vena cava. A right-sided chest tube tip projects within the right upper lobe region. A trace pneumothorax is appreciated along the right lung apex and periphery. The lungs are hyperinflated. No focal regions of consolidation nor focal infiltrates are appreciated. The cardiac silhouette is within normal limits the visualized osseous structures unremarkable.  IMPRESSION: 1. Decrease in the pleural air within the right hemi thorax. 2. Support lines and tubes as described above appear to be adequately positioned. 3. COPD without focal regions of consolidation or focal infiltrates.   Electronically Signed   By: Salome Holmes M.D.   On: 11/02/2013 08:29   Dg Abd Portable 1v  11/02/2013   CLINICAL DATA:  Tube placement.  EXAM: PORTABLE ABDOMEN - 1 VIEW  COMPARISON:  10/28/2013  FINDINGS: Enteric tube has been replaced. New tube overlies the left upper quadrant with tip projecting just right of midline in the expected region of the distal stomach. No dilated bowel loops are seen. No gross intraperitoneal free air is identified. Limited visualization of the lung bases demonstrates a slightly persistent, small left pleural effusion. No acute osseous abnormality is identified.  IMPRESSION: New enteric tube with tip likely in the distal stomach.   Electronically Signed   By: Sebastian Ache   On: 11/02/2013 13:45     Assessment/Plan: CXR shows improvement of right  pneumothorax.  No air leak at present. Would continue routine CT management, wean suction as able.  Continue current care per Pulm/CCM. Will be available if needed.  COLLINS,GINA H 11/03/2013 9:28 AM  Can wean suction on chest tube to water seal, wait to remove until off vent if will be soon, Please  call as needed I have seen and examined Ronnette Hila Gilreath and agree with the above assessment  and plan.  Delight Ovens MD Beeper [phone] Office [phone] 11/03/2013 3:12 PM

## 2013-11-03 NOTE — Progress Notes (Signed)
PULMONARY  / CRITICAL CARE MEDICINE  Name: Brandy [name] MRN: [id number] DOB: Jul 13, 1946    ADMISSION DATE:  10/27/2013 CONSULTATION DATE:  10/27/2013  REFERRING MD :  Maryruth Bun ICU PRIMARY SERVICE:  PCCM  CHIEF COMPLAINT:  Acute respiratory failure  BRIEF PATIENT DESCRIPTION: 67 yo with past medical history of COPD and CHF transferred from [facility] after being admitted x 2 weeks for acute respiratory failure.  Extubated 12/08.  Re intubated 12/09.  Course was complicated by pneumothorax requiring chest tube placement.  SIGNIFICANT EVENTS / STUDIES:  11/30  Admitted to [facility] with acute respiratory failure, failed BiPAP, intubated 12/08  Extubated 12/09  Re intubated 12/09  Pneumothorax / R chest tube placed 12/10 Transferred to [facility] 12/13 CT chest>>> R ptx 25-30%, lung tethered to pleural surface in multiple locations, R>L effusion, LLL PNA, stable RLL nodule ; TCTS consulted 12/15 borderline BP 12/16 trach  LINES / TUBES: ETT 12/09 >> 12/12, 12/12>>> R [place] CVL 12/09 >>  R chest tube 12/09 >>   CULTURES:  ANTIBIOTICS:  INTERVAL HISTORY: Trach without apparent complication  VITAL SIGNS: Temp:  [97.4 F (36.3 C)-100 F (37.8 C)] 100 F (37.8 C) (12/17 1145) Pulse Rate:  [63-117] 79 (12/17 1100) Resp:  [10-23] 10 (12/17 1100) BP: (82-154)/(40-86) 93/53 mmHg (12/17 1100) SpO2:  [97 %-100 %] 99 % (12/17 1100) FiO2 (%):  [40 %] 40 % (12/17 0850) HEMODYNAMICS:   VENTILATOR SETTINGS: Vent Mode:  [-] PRVC FiO2 (%):  [40 %] 40 % Set Rate:  [12 bmp-16 bmp] 12 bmp Vt Set:  [400 mL] 400 mL PEEP:  [5 cmH20] 5 cmH20 Pressure Support:  [10 cmH20] 10 cmH20 Plateau Pressure:  [18 cmH20-30 cmH20] 29 cmH20  INTAKE / OUTPUT: Intake/Output     12/16 0701 - 12/17 0700 12/17 0701 - 12/18 0700   I.V. (mL/kg) 972.9 (24.3) 185.2 (4.6)   Blood     NG/GT 210 230   Total Intake(mL/kg) 1182.9 (29.5) 415.2 (10.4)   Urine (mL/kg/hr) 985 (1) 175 (0.9)   Chest Tube 75 (0.1)     Total Output 1060 175   Net +122.9 +240.2          PHYSICAL EXAMINATION: General: no distress Neuro: rass 0 , follows simple commands HEENT: ETT, mm dry  Cardiovascular: RRR s M Lungs: cta Abdomen:  Soft, nontender, +BS Ext: no edema  LABS:  Recent Labs Lab 11/01/13 1500 11/02/13 0400 11/03/13 0410  HGB 7.7* 8.1* 8.6*  HCT 23.0* 24.4* 25.5*  WBC 8.9 8.2 9.6  PLT 201 207 240    Recent Labs Lab 10/27/13 1558  10/29/13 0450 10/30/13 0315 10/31/13 0500 11/01/13 0455 11/02/13 0400 11/03/13 0410  NA 136  < > 139 141 141 141 141 139  K 2.9*  < > 3.6 3.7 3.6 3.4* 4.0 4.0  CL 98  < > 105 109 110 110 111 109  CO2 32  < > 25 24 25 23 25 25   GLUCOSE 87  < > 101* 98 107* 103* 76 109*  BUN 12  < > 13 14 15 18 21 19   CREATININE 1.07  < > 1.19* 1.17* 1.11* 1.07 1.01 1.02  CALCIUM 7.7*  < > 7.8* 8.0* 7.7* 8.0* 8.1* 8.1*  MG 1.3*  --  1.7 1.7 2.1  --   --   --   PHOS 1.3*  --  2.4 2.9 3.1  --   --   --   < > = values in this interval  not displayed.  Recent Labs Lab 10/27/13 1558 11/02/13 0400  AST 29  --   ALT 28  --   ALKPHOS 59  --   BILITOT 0.7  --   PROT 4.3*  --   ALBUMIN 2.2*  --   INR 1.26 1.05    CXR: pending   ASSESSMENT / PLAN:  PULMONARY A:  Acute on chronic respiratory failure - failed extubation x 2  AECOPD.  R PTX with persistent air leak, improved 12/16 P:   Chest tube per cvts, no leak x 2 days pcxr now to order,and for am Trach noted, wean cpap5 ps 5, goal to trach collar and avoid pos pressure with PTX  CARDIOVASCULAR A: Borderline BP P:  Levophed to map 55 Dc fent drip See neuro bolus  RENAL A:  Hypokalemia resolved P:   bmet in am  Bolus repeat Hold lasix  GASTROINTESTINAL A:  Severe protein calorie malnutrition Risk of refeeding syndrome no longer P:   SUP: famotidine Continue TF , held for trach Likely to require peg,eval ability to trach collar  HEMATOLOGIC A:  Anemia without acute blood loss P:  DVT px: sq  heparin  INFECTIOUS A:  No definite acute infection P:   Follow fever curve , if spike add nosocomial hcap coverage  ENDOCRINE  A: Hypoglycemia frmo TF held P:   Monitor glu on chem panels ssi hold d5 in fluids, dc as T F back on  NEUROLOGIC A:  Acute encephalopathy History of alcohol abuse P:   fent drip to dc as goal Add rispirdal fent int   CC time 30 minutes.  Mcarthur Rossetti. Tyson Alias, MD, FACP Pgr: [phone]  Pulmonary & Critical Care

## 2013-11-04 ENCOUNTER — Inpatient Hospital Stay (HOSPITAL_COMMUNITY): Payer: Medicare Other

## 2013-11-04 LAB — GLUCOSE, CAPILLARY
Glucose-Capillary: 120 mg/dL — ABNORMAL HIGH (ref 70–99)
Glucose-Capillary: 82 mg/dL (ref 70–99)
Glucose-Capillary: 89 mg/dL (ref 70–99)
Glucose-Capillary: 90 mg/dL (ref 70–99)
Glucose-Capillary: 93 mg/dL (ref 70–99)

## 2013-11-04 LAB — BASIC METABOLIC PANEL
CO2: 25 mEq/L (ref 19–32)
Calcium: 8.1 mg/dL — ABNORMAL LOW (ref 8.4–10.5)
Chloride: 108 mEq/L (ref 96–112)
Creatinine, Ser: 1.04 mg/dL (ref 0.50–1.10)
GFR calc Af Amer: 63 mL/min — ABNORMAL LOW (ref >90)
Glucose, Bld: 139 mg/dL — ABNORMAL HIGH (ref 70–99)
Potassium: 3.9 mEq/L (ref 3.5–5.1)
Sodium: 138 mEq/L (ref 135–145)

## 2013-11-04 MED ORDER — HEPARIN SODIUM (PORCINE) 5000 UNIT/ML IJ SOLN
5000.0000 [IU] | Freq: Three times a day (TID) | INTRAMUSCULAR | Status: DC
  Filled 2013-11-04: qty 1

## 2013-11-04 MED ORDER — CEFAZOLIN SODIUM-DEXTROSE 2-3 GM-% IV SOLR
2.0000 g | Freq: Once | INTRAVENOUS | Status: DC
  Filled 2013-11-04: qty 50

## 2013-11-04 NOTE — Clinical Social Work Note (Signed)
Clinical Social Work Department BRIEF PSYCHOSOCIAL ASSESSMENT 11/04/2013  Patient:  Brandy [name], Brandy [name]     Account Number:  [id number]     Admit date:  10/27/2013  Clinical Social Worker:  Read Drivers  Date/Time:  11/04/2013 02:10 PM  Referred by:  Care Management  Date Referred:  11/04/2013 Referred for  SNF Placement   Other Referral:   none   Interview type:  Other - See comment Other interview type:   CSW spoke with mother, Brandy [name] and sister-in-law who were at bedside.  CSW attempted to contact pt son, Brandy [name] who is the pt medical decision maker.  Brandy [name] was unavailable (working).  CSW left message with his wife.    PSYCHOSOCIAL DATA Living Status:  FAMILY Admitted from facility:   Level of care:   Primary support name:  Brandy [name] Primary support relationship to patient:  CHILD, ADULT Degree of support available:   strong    CURRENT CONCERNS Current Concerns  Post-Acute Placement   Other Concerns:   pt will be long term respiratory care - on vent and unable to wean at this time    SOCIAL WORK ASSESSMENT / PLAN CSW assessed pt at beside.  Pt is on vent and unable to participate in assessment.  Pt mother and sister-in-law was at bedside.  CSW spoke with mother and sister-in-law regarding vent SNF disposition for pt upon dc.  Mother and sister-in-law acknowleged understanding.  CSW will also review this information with Lorenzo.  Pt is scheduled to transfer off of 33M at which time 33M CSW will provide handoff to unit CSW regarding the need to contact son.   Assessment/plan status:  Psychosocial Support/Ongoing Assessment of Needs Other assessment/ plan:   none   Information/referral to community resources:   SNF- vent    PATIENT'S/FAMILY'S RESPONSE TO PLAN OF CARE: Pt mother and sister-in-law appreciated CSW support and information regarding possible dispostion for pt.       Vickii Penna, LCSWA [phone]  Clinical Social Work

## 2013-11-04 NOTE — Op Note (Signed)
[name], Brandy [name] NO.:  [id number]  MEDICAL RECORD NO.:  [id number]  LOCATION:  2M02C                        FACILITY:  [facility]  PHYSICIAN:  Nelda Bucks, MD DATE OF BIRTH:  Feb 26, 1946  DATE OF PROCEDURE:  11/02/2013 DATE OF DISCHARGE:                              OPERATIVE REPORT   PROCEDURE:  Percutaneous tracheostomy.  PREOPERATIVE DIAGNOSES:  Status post pneumothorax, iatrogenic from outside hospital, chronic obstructive pulmonary disease exacerbation, failed extubation secondary to chronic obstructive pulmonary disease exacerbation.  POSTOPERATIVE DIAGNOSES:  Status post tracheostomy secondary to chronic obstructive pulmonary disease, pneumothorax from central line.  BRONCHOSCOPIST FOR THE PROCEDURE:  Lonia Farber, MD  DESCRIPTION OF PROCEDURE:  Consent was obtained from the patient's sons and medical power of attorney, brother fully aware of risks and benefits of procedure including infection, bleeding, pneumothorax, and death. The patient was placed in supine position.  Chlorhexidine preparation was used to sterilize the operative site.  The endotracheal tube pulled back to approximately 15 cm.  After chlorhexidine preparation, 5 mL of lidocaine plus epinephrine was injected over second endotracheal space. An 18-gauge needle over white catheter sheath was introduced into the airway with air back and no posterior wall injury.  I did dissect prior to this and dissected the strap muscles.  There were no vascular structures noted.  Wire was placed through the white catheter sheath and white catheter sheath was removed.  A 14-French punch dilator was placed over the wire in and out, and progressive rhino dilator was placed over a glider, in and out of the airway.  The glider and wire remained.  The rhino dilator was discontinued.  A size 6 tracheostomy over 26-French dilator was placed over the wire and glider accessed in the  airway. Everything was removed except for the tracheostomy.  The  tracheostomy was sutured in place with 4-0 monofilament sutures.  BLOOD LOSS:  Procedure less than 1 mL.  The patient tolerated the procedure quite well.  Postoperative portable chest x-ray revealed a well placed tracheostomy tube without apparent complication.  The patient can follow up in our outpatient tracheostomy clinic by calling [phone].     Nelda Bucks, MD    DJF/MEDQ  D:  11/03/2013  T:  11/04/2013  Job:  [id number]

## 2013-11-04 NOTE — Clinical Social Work Note (Signed)
CSW attempted to contact son, [name], to discuss disposition.  Message was left with Lorenzo's wife.  CSW awaiting a return call.  Vickii Penna, LCSWA [phone]  Clinical Social Work

## 2013-11-04 NOTE — Progress Notes (Signed)
RT unavailable for am vent check due to emergency.

## 2013-11-04 NOTE — H&P (Signed)
Brandy [name] is an 67 y.o. female.   Chief Complaint: COPD; CHF Acute on chronic resp failure To [facility] and transferred to [facility] for ventilator management Malnutrition; encephalopathy Plan for Skilled nursing facility soon Need long term care Scheduled for percutaneous gastric tube placement HPI: COPD; alc abuse; encephalopathy; CHF  Past Medical History  Diagnosis Date  . COPD (chronic obstructive pulmonary disease)   . CHF (congestive heart failure)     Past Surgical History  Procedure Laterality Date  . Tracheostomy      feinstein    History reviewed. No pertinent family history. Social History:  reports that she has quit smoking. She does not have any smokeless tobacco history on file. Her alcohol and drug histories are not on file.  Allergies: No Known Allergies  Medications Prior to Admission  Medication Sig Dispense Refill  . budesonide-formoterol (SYMBICORT) 80-4.5 MCG/ACT inhaler Inhale 2 puffs into the lungs 2 (two) times daily.      . Ipratropium-Albuterol (COMBIVENT RESPIMAT) 20-100 MCG/ACT AERS respimat Inhale 1 puff into the lungs every 6 (six) hours.      Marland Kitchen lisinopril-hydrochlorothiazide (PRINZIDE,ZESTORETIC) 20-12.5 MG per tablet Take 1 tablet by mouth daily.      Marland Kitchen lovastatin (MEVACOR) 20 MG tablet Take 20 mg by mouth daily.        Results for orders placed during the hospital encounter of 10/27/13 (from the past 48 hour(s))  GLUCOSE, CAPILLARY     Status: None   Collection Time    11/02/13  3:15 PM      Result Value Range   Glucose-Capillary 83  70 - 99 mg/dL  GLUCOSE, CAPILLARY     Status: None   Collection Time    11/02/13  7:54 PM      Result Value Range   Glucose-Capillary 92  70 - 99 mg/dL  GLUCOSE, CAPILLARY     Status: Abnormal   Collection Time    11/02/13 11:10 PM      Result Value Range   Glucose-Capillary 109 (*) 70 - 99 mg/dL  GLUCOSE, CAPILLARY     Status: None   Collection Time    11/03/13  3:33 AM      Result Value  Range   Glucose-Capillary 84  70 - 99 mg/dL  BASIC METABOLIC PANEL     Status: Abnormal   Collection Time    11/03/13  4:10 AM      Result Value Range   Sodium 139  135 - 145 mEq/L   Potassium 4.0  3.5 - 5.1 mEq/L   Chloride 109  96 - 112 mEq/L   CO2 25  19 - 32 mEq/L   Glucose, Bld 109 (*) 70 - 99 mg/dL   BUN 19  6 - 23 mg/dL   Creatinine, Ser 1.61  0.50 - 1.10 mg/dL   Calcium 8.1 (*) 8.4 - 10.5 mg/dL   GFR calc non Af Amer 55 (*) >90 mL/min   GFR calc Af Amer 64 (*) >90 mL/min   Comment: (NOTE)     The eGFR has been calculated using the CKD EPI equation.     This calculation has not been validated in all clinical situations.     eGFR's persistently <90 mL/min signify possible Chronic Kidney     Disease.  CBC WITH DIFFERENTIAL     Status: Abnormal   Collection Time    11/03/13  4:10 AM      Result Value Range   WBC 9.6  4.0 -  10.5 K/uL   RBC 3.04 (*) 3.87 - 5.11 MIL/uL   Hemoglobin 8.6 (*) 12.0 - 15.0 g/dL   HCT 40.9 (*) 81.1 - 91.4 %   MCV 83.9  78.0 - 100.0 fL   MCH 28.3  26.0 - 34.0 pg   MCHC 33.7  30.0 - 36.0 g/dL   RDW 78.2 (*) 95.6 - 21.3 %   Platelets 240  150 - 400 K/uL   Neutrophils Relative % 78 (*) 43 - 77 %   Neutro Abs 7.5  1.7 - 7.7 K/uL   Lymphocytes Relative 8 (*) 12 - 46 %   Lymphs Abs 0.8  0.7 - 4.0 K/uL   Monocytes Relative 11  3 - 12 %   Monocytes Absolute 1.1 (*) 0.1 - 1.0 K/uL   Eosinophils Relative 2  0 - 5 %   Eosinophils Absolute 0.2  0.0 - 0.7 K/uL   Basophils Relative 0  0 - 1 %   Basophils Absolute 0.0  0.0 - 0.1 K/uL  GLUCOSE, CAPILLARY     Status: None   Collection Time    11/03/13  8:37 AM      Result Value Range   Glucose-Capillary 79  70 - 99 mg/dL  GLUCOSE, CAPILLARY     Status: Abnormal   Collection Time    11/03/13 11:34 AM      Result Value Range   Glucose-Capillary 122 (*) 70 - 99 mg/dL  GLUCOSE, CAPILLARY     Status: Abnormal   Collection Time    11/03/13  3:03 PM      Result Value Range   Glucose-Capillary 116 (*) 70 -  99 mg/dL  GLUCOSE, CAPILLARY     Status: None   Collection Time    11/03/13  7:07 PM      Result Value Range   Glucose-Capillary 95  70 - 99 mg/dL  GLUCOSE, CAPILLARY     Status: None   Collection Time    11/03/13 11:35 PM      Result Value Range   Glucose-Capillary 82  70 - 99 mg/dL   Comment 1 Documented in Chart     Comment 2 Notify RN    BASIC METABOLIC PANEL     Status: Abnormal   Collection Time    11/04/13  4:30 AM      Result Value Range   Sodium 138  135 - 145 mEq/L   Potassium 3.9  3.5 - 5.1 mEq/L   Chloride 108  96 - 112 mEq/L   CO2 25  19 - 32 mEq/L   Glucose, Bld 139 (*) 70 - 99 mg/dL   BUN 21  6 - 23 mg/dL   Creatinine, Ser 0.86  0.50 - 1.10 mg/dL   Calcium 8.1 (*) 8.4 - 10.5 mg/dL   GFR calc non Af Amer 54 (*) >90 mL/min   GFR calc Af Amer 63 (*) >90 mL/min   Comment: (NOTE)     The eGFR has been calculated using the CKD EPI equation.     This calculation has not been validated in all clinical situations.     eGFR's persistently <90 mL/min signify possible Chronic Kidney     Disease.  GLUCOSE, CAPILLARY     Status: Abnormal   Collection Time    11/04/13  4:38 AM      Result Value Range   Glucose-Capillary 120 (*) 70 - 99 mg/dL   Comment 1 Documented in Chart     Comment 2 Notify  RN    GLUCOSE, CAPILLARY     Status: None   Collection Time    11/04/13  8:35 AM      Result Value Range   Glucose-Capillary 99  70 - 99 mg/dL  GLUCOSE, CAPILLARY     Status: None   Collection Time    11/04/13 11:18 AM      Result Value Range   Glucose-Capillary 90  70 - 99 mg/dL   Dg Chest Port 1 View  11/04/2013   CLINICAL DATA:  Pneumothorax.  Congestive heart failure .  EXAM: PORTABLE CHEST - 1 VIEW  COMPARISON:  11/03/2013.  FINDINGS: Tracheostomy tube noted in good anatomic position. Feeding tube and right subclavian central line in stable position. Right chest tube in stable position. A tiny right apical pneumothorax is again noted. This is less obvious on today's  exam. Cardiomegaly with pulmonary venous congestion, interstitial prominence, and bilateral pleural effusions consistent with congestive heart failure. Underlying basilar pneumonia cannot be excluded. No acute bony abnormality.  IMPRESSION: 1. Stable line and tube positions. Right chest tube in stable position. 2. Tiny right apical pneumothorax again noted, this is barely perceptible on today's exam. 3. Congestive heart failure with pulmonary edema and bilateral pleural effusions.   Electronically Signed   By: Maisie Fus  Register   On: 11/04/2013 07:15   Dg Chest Port 1 View  11/03/2013   CLINICAL DATA:  Pneumothorax  EXAM: PORTABLE CHEST - 1 VIEW  COMPARISON:  November 02, 2013  FINDINGS: Tracheostomy is well seated. Central catheter tip is at the cavoatrial junction. Feeding tube extends below the diaphragm. Chest tube remains in the right. There is a minimal right apical pneumothorax, slightly smaller than on 1 day previous.  There are bilateral effusions with interstitial edema. There is consolidation in the medial left base, stable. Heart is upper normal in size with pulmonary venous hypertension.  IMPRESSION: Minimal right apical pneumothorax, slightly smaller than on study 1 day prior. Right chest tube remains in position. Other tubes and catheters as described. Evidence of congestive heart failure. Question superimposed pneumonia medial left base.   Electronically Signed   By: Bretta Bang M.D.   On: 11/03/2013 07:11    Review of Systems  Constitutional: Positive for weight loss. Negative for fever.  Respiratory: Positive for shortness of breath.   Gastrointestinal: Negative for vomiting.  Neurological: Positive for weakness.    Blood pressure 76/40, pulse 88, temperature 98 F (36.7 C), temperature source Oral, resp. rate 12, height 5\' 1"  (1.549 m), weight 88 lb 6.5 oz (40.1 kg), SpO2 100.00%. Physical Exam  Constitutional:  Thin, frail, on vent; no response  Cardiovascular: Normal rate  and normal heart sounds.   Respiratory: She is in respiratory distress. She has wheezes.  GI: Soft.  Psychiatric:  Consented son via phone     Assessment/Plan On vent resp failure SNF soon Malnutrition;need long term care Scheduled for G tube in IR 12/19 pts son aware of procedure benefits and risks and agreeable to proceed Consent signed and in chart Hep inj held; ancef ordered Check kub  Faria Casella A 11/04/2013, 2:48 PM

## 2013-11-04 NOTE — Progress Notes (Signed)
PULMONARY  / CRITICAL CARE MEDICINE  Name: Brandy [name] MRN: [id number] DOB: 05/17/1946    ADMISSION DATE:  10/27/2013 CONSULTATION DATE:  10/27/2013  REFERRING MD :  Maryruth Bun ICU PRIMARY SERVICE:  PCCM  CHIEF COMPLAINT:  Acute respiratory failure  BRIEF PATIENT DESCRIPTION: 67 yo with past medical history of COPD and CHF transferred from [facility] after being admitted x 2 weeks for acute respiratory failure.  Extubated 12/08.  Re intubated 12/09.  Course was complicated by pneumothorax requiring chest tube placement.  SIGNIFICANT EVENTS / STUDIES:  11/30  Admitted to [facility] with acute respiratory failure, failed BiPAP, intubated 12/08  Extubated 12/09  Re intubated 12/09  Pneumothorax / R chest tube placed 12/10 Transferred to [facility] 12/13 CT chest>>> R ptx 25-30%, lung tethered to pleural surface in multiple locations, R>L effusion, LLL PNA, stable RLL nodule ; TCTS consulted 12/15 borderline BP 12/16 trach  LINES / TUBES: ETT 12/09 >> 12/12, 12/12>>>12/16 12/16 trach (df)>>> R [place] CVL 12/09 >>  R chest tube 12/09 >>   CULTURES:  ANTIBIOTICS:  INTERVAL HISTORY: Low dose pressors  VITAL SIGNS: Temp:  [97.8 F (36.6 C)-99.2 F (37.3 C)] 98 F (36.7 C) (12/18 1119) Pulse Rate:  [66-113] 98 (12/18 0630) Resp:  [10-20] 12 (12/18 0630) BP: (74-140)/(33-58) 98/47 mmHg (12/18 0630) SpO2:  [97 %-100 %] 100 % (12/18 0630) FiO2 (%):  [40 %] 40 % (12/18 0800) HEMODYNAMICS: CVP:  [11 mmHg-15 mmHg] 14 mmHg VENTILATOR SETTINGS: Vent Mode:  [-] PRVC FiO2 (%):  [40 %] 40 % Set Rate:  [12 bmp] 12 bmp Vt Set:  [400 mL] 400 mL PEEP:  [5 cmH20] 5 cmH20 Plateau Pressure:  [14 cmH20-34 cmH20] 14 cmH20  INTAKE / OUTPUT: Intake/Output     12/17 0701 - 12/18 0700 12/18 0701 - 12/19 0700   I.V. (mL/kg) 934.1 (23.3) 140 (3.5)   NG/GT 1080 140   Total Intake(mL/kg) 2014.1 (50.2) 280 (7)   Urine (mL/kg/hr) 970 (1) 175 (0.8)   Chest Tube 100 (0.1)    Total Output 1070 175    Net +944.1 +105          PHYSICAL EXAMINATION: General: no distress Neuro: rass 0 , follows simple commands HEENT: ETT, mm dry  Cardiovascular: RRR s M Lungs: cta Abdomen:  Soft, nontender, +BS Ext: no edema  LABS:  Recent Labs Lab 11/01/13 1500 11/02/13 0400 11/03/13 0410  HGB 7.7* 8.1* 8.6*  HCT 23.0* 24.4* 25.5*  WBC 8.9 8.2 9.6  PLT 201 207 240    Recent Labs Lab 10/29/13 0450 10/30/13 0315 10/31/13 0500 11/01/13 0455 11/02/13 0400 11/03/13 0410 11/04/13 0430  NA 139 141 141 141 141 139 138  K 3.6 3.7 3.6 3.4* 4.0 4.0 3.9  CL 105 109 110 110 111 109 108  CO2 25 24 25 23 25 25 25   GLUCOSE 101* 98 107* 103* 76 109* 139*  BUN 13 14 15 18 21 19 21   CREATININE 1.19* 1.17* 1.11* 1.07 1.01 1.02 1.04  CALCIUM 7.8* 8.0* 7.7* 8.0* 8.1* 8.1* 8.1*  MG 1.7 1.7 2.1  --   --   --   --   PHOS 2.4 2.9 3.1  --   --   --   --     Recent Labs Lab 11/02/13 0400  INR 1.05    CXR: msall ptx at best rt, bilateral infiltrates  ASSESSMENT / PLAN:  PULMONARY A:  Acute on chronic respiratory failure - failed extubation x 2  AECOPD.  R PTX with persistent air leak, improved 12/16 P:   Chest tube per cvts, no leak x 3 days, consider to water seal, repeat pcxr, will d/w cvts pcxr in am  Failed cpap5 ps5, failed, to ps 15 attempt  CARDIOVASCULAR A: Borderline BP, inaccurate cuff? P:  Levophed to map 50 with urine 15 cc/hr or sys greater 80 tele  RENAL A:  Hypokalemia resolved P:   bmet in am  kvo  GASTROINTESTINAL A:  Severe protein calorie malnutrition Risk of refeeding syndrome no longer P:   SUP: famotidine Continue TF Place pEG  HEMATOLOGIC A:  Anemia without acute blood loss P:  DVT px: sq heparin Cbc in am   INFECTIOUS A:  No definite acute infection P:   Follow fever curve , if spike add nosocomial hcap coverage  ENDOCRINE  A: Hypoglycemia frmo TF held P:   Monitor glu on chem panels ssi hold  NEUROLOGIC A:  Acute  encephalopathy History of alcohol abuse P:   fent drip to dc as goal Continue rispirdal fent int   CC time 30 minutes.  Brandy [name]. Tyson Alias, MD, FACP Pgr: [phone] [facility]

## 2013-11-05 ENCOUNTER — Inpatient Hospital Stay (HOSPITAL_COMMUNITY): Payer: Medicare Other

## 2013-11-05 LAB — CORTISOL: Cortisol, Plasma: 17.8 ug/dL

## 2013-11-05 LAB — CBC
Hemoglobin: 8.7 g/dL — ABNORMAL LOW (ref 12.0–15.0)
MCHC: 32.2 g/dL (ref 30.0–36.0)
Platelets: 264 10*3/uL (ref 150–400)
RDW: 17.2 % — ABNORMAL HIGH (ref 11.5–15.5)
WBC: 8.3 10*3/uL (ref 4.0–10.5)

## 2013-11-05 LAB — BASIC METABOLIC PANEL
BUN: 22 mg/dL (ref 6–23)
Calcium: 8.7 mg/dL (ref 8.4–10.5)
Chloride: 106 mEq/L (ref 96–112)
Creatinine, Ser: 0.96 mg/dL (ref 0.50–1.10)
GFR calc Af Amer: 69 mL/min — ABNORMAL LOW (ref >90)
GFR calc non Af Amer: 59 mL/min — ABNORMAL LOW (ref >90)
Potassium: 4.3 mEq/L (ref 3.5–5.1)

## 2013-11-05 LAB — GLUCOSE, CAPILLARY
Glucose-Capillary: 87 mg/dL (ref 70–99)
Glucose-Capillary: 101 mg/dL — ABNORMAL HIGH (ref 70–99)

## 2013-11-05 LAB — LACTIC ACID, PLASMA: Lactic Acid, Venous: 0.4 mmol/L — ABNORMAL LOW (ref 0.5–2.2)

## 2013-11-05 MED ORDER — HYDROCORTISONE SOD SUCCINATE 100 MG IJ SOLR
50.0000 mg | Freq: Four times a day (QID) | INTRAMUSCULAR | Status: DC
  Administered 2013-11-05 – 2013-11-08 (×13): 50 mg via INTRAVENOUS
  Filled 2013-11-05 (×18): qty 1

## 2013-11-05 MED ORDER — HEPARIN SODIUM (PORCINE) 5000 UNIT/ML IJ SOLN
5000.0000 [IU] | Freq: Three times a day (TID) | INTRAMUSCULAR | Status: DC
  Administered 2013-11-05 – 2013-11-08 (×7): 5000 [IU] via SUBCUTANEOUS
  Filled 2013-11-05 (×11): qty 1

## 2013-11-05 MED ORDER — CEFAZOLIN SODIUM-DEXTROSE 2-3 GM-% IV SOLR
2.0000 g | Freq: Once | INTRAVENOUS | Status: AC
  Administered 2013-11-08: 2 g via INTRAVENOUS
  Filled 2013-11-05: qty 50

## 2013-11-05 MED ORDER — FENTANYL CITRATE 0.05 MG/ML IJ SOLN
25.0000 ug | INTRAMUSCULAR | Status: DC | PRN
  Administered 2013-11-05 – 2013-11-15 (×18): 25 ug via INTRAVENOUS
  Filled 2013-11-05 (×18): qty 2

## 2013-11-05 NOTE — Progress Notes (Signed)
Patient ID: Brandy [name], female   DOB: Apr 09, 1946, 67 y.o.   MRN: [id number]   Pt was scheduled for perc G tube in IR today.  Too much barium remains in stomach per Radiologist Will reschedule for 12/22  RN aware  PA writing orders for same

## 2013-11-05 NOTE — Progress Notes (Signed)
PULMONARY  / CRITICAL CARE MEDICINE  Name: Brandy [name] MRN: [id number] DOB: 08-Apr-1946    ADMISSION DATE:  10/27/2013 CONSULTATION DATE:  10/27/2013  REFERRING MD :  Maryruth Bun ICU PRIMARY SERVICE:  PCCM  CHIEF COMPLAINT:  Acute respiratory failure  BRIEF PATIENT DESCRIPTION: 67 yo with past medical history of COPD and CHF transferred from [facility] after being admitted x 2 weeks for acute respiratory failure.  Extubated 12/08.  Re intubated 12/09.  Course was complicated by pneumothorax requiring chest tube placement.  SIGNIFICANT EVENTS / STUDIES:  11/30  Admitted to [facility] with acute respiratory failure, failed BiPAP, intubated 12/08  Extubated 12/09  Re intubated 12/09  Pneumothorax / R chest tube placed 12/10 Transferred to [facility] 12/13 CT chest>>> R ptx 25-30%, lung tethered to pleural surface in multiple locations, R>L effusion, LLL PNA, stable RLL nodule ; TCTS consulted 12/15 borderline BP 12/16 trach 12/18 - pressors off / on / off  LINES / TUBES: ETT 12/09 >> 12/12, 12/12>>>12/16 12/16 trach (df)>>> R [place] CVL 12/09 >>  R chest tube 12/09 >>   CULTURES:  ANTIBIOTICS:  INTERVAL HISTORY: Low dose pressors off again  VITAL SIGNS: Temp:  [97.6 F (36.4 C)-98.3 F (36.8 C)] 98.3 F (36.8 C) (12/19 0424) Pulse Rate:  [56-123] 103 (12/19 0545) Resp:  [12-27] 14 (12/19 0615) BP: (69-141)/(40-87) 92/51 mmHg (12/19 0615) SpO2:  [98 %-100 %] 100 % (12/19 0545) FiO2 (%):  [40 %] 40 % (12/19 0500) HEMODYNAMICS: CVP:  [9 mmHg-60 mmHg] 9 mmHg VENTILATOR SETTINGS: Vent Mode:  [-] PRVC FiO2 (%):  [40 %] 40 % Set Rate:  [12 bmp] 12 bmp Vt Set:  [400 mL] 400 mL PEEP:  [5 cmH20] 5 cmH20 Plateau Pressure:  [14 cmH20-21 cmH20] 17 cmH20  INTAKE / OUTPUT: Intake/Output     12/18 0701 - 12/19 0700   I.V. (mL/kg) 701.9 (17.5)   NG/GT 1135   Total Intake(mL/kg) 1836.9 (45.8)   Urine (mL/kg/hr) 985 (1)   Total Output 985   Net +851.9         PHYSICAL  EXAMINATION: General: no distress Neuro: rass 1 , follows simple commands HEENT: ETT, mm dry  Cardiovascular: RRR s M Lungs: cta, no crepitus Abdomen:  Soft, nontender, +BS Ext: no edema  LABS:  Recent Labs Lab 11/02/13 0400 11/03/13 0410 11/05/13 0445  HGB 8.1* 8.6* 8.7*  HCT 24.4* 25.5* 27.0*  WBC 8.2 9.6 8.3  PLT 207 240 264    Recent Labs Lab 10/30/13 0315 10/31/13 0500 11/01/13 0455 11/02/13 0400 11/03/13 0410 11/04/13 0430 11/05/13 0445  NA 141 141 141 141 139 138 137  K 3.7 3.6 3.4* 4.0 4.0 3.9 4.3  CL 109 110 110 111 109 108 106  CO2 24 25 23 25 25 25 26   GLUCOSE 98 107* 103* 76 109* 139* 125*  BUN 14 15 18 21 19 21 22   CREATININE 1.17* 1.11* 1.07 1.01 1.02 1.04 0.96  CALCIUM 8.0* 7.7* 8.0* 8.1* 8.1* 8.1* 8.7  MG 1.7 2.1  --   --   --   --   --   PHOS 2.9 3.1  --   --   --   --   --     Recent Labs Lab 11/02/13 0400  INR 1.05    CXR: 12/19- no ptx, unchanged chronci changes, trach wnl  ASSESSMENT / PLAN:  PULMONARY A:  Acute on chronic respiratory failure - failed extubation x 2  AECOPD.  R PTX with  persistent air leak, improved 12/16 P:   Chest tube per cvts, no leak x 4 days, consider to water seal then pcxr in 4 hrs then in am  Prior failed ps weans, start PS 15-18 today then to goal 12 if able No trach collar tolerable as of yet Consider neg balance , lasix, await better BP  CARDIOVASCULAR A: Borderline BP, inaccurate cuff?, r/o rel AI P:  Levophed to map 50 with urine 15 cc/hr or sys greater 80 Tele Had cortisol 10 on 10th, repeat and add stress roids empiric asses lactic acid to prove perfusion at lower MAP  RENAL A:  Hypokalemia resolved P:   bmet in am  kvo Lasix is plan once bP improved  GASTROINTESTINAL A:  Severe protein calorie malnutrition Risk of refeeding syndrome no longer P:   SUP: famotidine Continue TF held for peg Place pEG today  HEMATOLOGIC A:  Anemia without acute blood loss P:  DVT px: sq  heparin Cbc in am   INFECTIOUS A:  No definite acute infection P:   ABX for peg only per IR  ENDOCRINE  A: Hypoglycemia from TF held P:   Monitor glu on chem panels Increase d5 as TF held for peg  NEUROLOGIC A:  Acute encephalopathy History of alcohol abuse P:   fent drip to dc, NO RESTART, NOT REQUIRED Continue rispirdal, may need increase fent int  Consider to 2600 after peg  CC time 30 minutes.  Mcarthur Rossetti. Tyson Alias, MD, FACP Pgr: [phone] [place]

## 2013-11-05 NOTE — Progress Notes (Signed)
CSW received call from pt's previous CSW giving this new CSW a handoff. Pt is on trach & vent, and failed vent wean when pt was on previous floor. Previous CSW explained that vent SNF options are limited and pt might have to be transferred to a facility far away [facility] or further). If pt can wean to trach options for placement are far greater. Order for palliative might be appropriate, as pt is still full code. CSW put in a physician sticky note requesting this if MD feels it is appropriate. Pt's son [name] has been appointed the decision-maker, and CSW will contact him early next week [phone]).   Maryclare Labrador, MSW, [facility] Clinical Social Worker [phone]

## 2013-11-05 NOTE — Progress Notes (Signed)
Wasted remaining 100 ml of fentanyl from patient's 250 ml fentanyl drip into the sink.  Witnessed by Manon Hilding, RN.

## 2013-11-06 ENCOUNTER — Inpatient Hospital Stay (HOSPITAL_COMMUNITY): Payer: Medicare Other

## 2013-11-06 LAB — BASIC METABOLIC PANEL
BUN: 28 mg/dL — ABNORMAL HIGH (ref 6–23)
Calcium: 8.7 mg/dL (ref 8.4–10.5)
Creatinine, Ser: 0.93 mg/dL (ref 0.50–1.10)
GFR calc Af Amer: 72 mL/min — ABNORMAL LOW (ref >90)
GFR calc non Af Amer: 62 mL/min — ABNORMAL LOW (ref >90)
Glucose, Bld: 183 mg/dL — ABNORMAL HIGH (ref 70–99)
Potassium: 3.7 mEq/L (ref 3.5–5.1)

## 2013-11-06 NOTE — Progress Notes (Signed)
PULMONARY  / CRITICAL CARE MEDICINE  Name: Brandy [name] MRN: [id number] DOB: [birth year]    ADMISSION DATE:  10/27/2013 CONSULTATION DATE:  10/27/2013  REFERRING MD :  Maryruth Bun ICU PRIMARY SERVICE:  PCCM  CHIEF COMPLAINT:  Acute respiratory failure  BRIEF PATIENT DESCRIPTION: 67 yo with past medical history of COPD and CHF transferred from [facility] after being admitted x 2 weeks for acute respiratory failure.  Extubated 12/08.  Re intubated 12/09.  Course was complicated by right  pneumothorax requiring chest tube placement.  SIGNIFICANT EVENTS / STUDIES:  11/30  Admitted to [facility] with acute respiratory failure, failed BiPAP, intubated 12/08  Extubated 12/09  Re intubated 12/09  Pneumothorax / R chest tube placed 12/10 Transferred to [facility] 12/13 CT chest>>> R ptx 25-30%, lung tethered to pleural surface in multiple locations, R>L effusion, LLL PNA, stable RLL nodule ; TCTS consulted 12/15 borderline BP 12/16 trach 12/18 - pressors off / on / off  LINES / TUBES: ETT 12/09 >> 12/12, 12/12>>>12/16 12/16 trach (df)>>> R [place] CVL 12/09 >>  R chest tube 12/09 >>   CULTURES:  ANTIBIOTICS:  INTERVAL HISTORY/  Subjective somwhat agitated this am   VITAL SIGNS: Temp:  [97.3 F (36.3 C)-98.4 F (36.9 C)] 98.2 F (36.8 C) (12/20 1120) Pulse Rate:  [72-145] 108 (12/20 1120) Resp:  [10-34] 23 (12/20 1120) BP: (91-173)/(50-89) 133/63 mmHg (12/20 1120) SpO2:  [95 %-100 %] 100 % (12/20 1000) FiO2 (%):  [30 %-40 %] 40 % (12/20 1120) HEMODYNAMICS:   VENTILATOR SETTINGS: Vent Mode:  [-] PRVC FiO2 (%):  [30 %-40 %] 40 % Set Rate:  [12 bmp] 12 bmp Vt Set:  [400 mL] 400 mL PEEP:  [5 cmH20] 5 cmH20 Plateau Pressure:  [22 cmH20-31 cmH20] 22 cmH20  INTAKE / OUTPUT: Intake/Output     12/19 0701 - 12/20 0700 12/20 0701 - 12/21 0700   I.V. (mL/kg) 944 (23.5)    NG/GT 562.1    Total Intake(mL/kg) 1506.1 (37.6)    Urine (mL/kg/hr) 1745 (1.8)    Chest Tube 350 (0.4) 50  (0.2)   Total Output 2095 50   Net -588.9 -50        Urine Occurrence 1 x    Stool Occurrence  1 x     PHYSICAL EXAMINATION: General: restless, having trouble communicating needs  Neuro: rass 1 , follows simple commands HEENT: tcollar  mm dry  Cardiovascular: RRR s M Lungs: cta, no crepitus Abdomen:  Soft, nontender, +BS Ext: no edema  LABS:  Recent Labs Lab 11/02/13 0400 11/03/13 0410 11/05/13 0445  HGB 8.1* 8.6* 8.7*  HCT 24.4* 25.5* 27.0*  WBC 8.2 9.6 8.3  PLT 207 240 264    Recent Labs Lab 10/31/13 0500  11/02/13 0400 11/03/13 0410 11/04/13 0430 11/05/13 0445 11/06/13 0530  NA 141  < > 141 139 138 137 133*  K 3.6  < > 4.0 4.0 3.9 4.3 3.7  CL 110  < > 111 109 108 106 99  CO2 25  < > 25 25 25 26 27   GLUCOSE 107*  < > 76 109* 139* 125* 183*  BUN 15  < > 21 19 21 22  28*  CREATININE 1.11*  < > 1.01 1.02 1.04 0.96 0.93  CALCIUM 7.7*  < > 8.1* 8.1* 8.1* 8.7 8.7  MG 2.1  --   --   --   --   --   --   PHOS 3.1  --   --   --   --   --   --   < > =  values in this interval not displayed.  Recent Labs Lab 11/02/13 0400  INR 1.05    CXR: 12/120 1. Stable support apparatus. Small residual right apical  pneumothorax.  2. Slightly improved aeration of the lung bases. Some of this  probably represents positional shift.   ASSESSMENT / PLAN:  PULMONARY A:  Acute on chronic respiratory failure - failed extubation x 2  AECOPD.  R PTX with persistent air leak, improved 12/16 P:   Chest tube per cvts  Wean ps as tol to T collar  CARDIOVASCULAR A: Borderline BP, inaccurate cuff?, r/o rel AI Had cortisol 10 on 10th, repeat = 17.8  12/19  P:  Tele Hold pressors for now      RENAL A:  Hypokalemia resolved P:    kvo   GASTROINTESTINAL A:  Severe protein calorie malnutrition Risk of refeeding syndrome no longer P:   SUP: famotidine    HEMATOLOGIC A:  Anemia without acute blood loss  Recent Labs Lab 11/02/13 0400 11/03/13 0410 11/05/13 0445   HGB 8.1* 8.6* 8.7*    P:  DVT px: sq heparin     INFECTIOUS A:  No definite acute infection P:   ABX for peg only per IR  ENDOCRINE  A: Hypoglycemia from TF held P:   Monitor glu on chem panels    NEUROLOGIC A:  Acute encephalopathy History of alcohol abuse P:   fent drip to dc, NO RESTART, NOT REQUIRED Continue rispirdal, may need increase fent int     Sandrea Hughs, MD Pulmonary and Critical Care Medicine [facility] Cell [phone] After 5:30 PM or weekends, call [phone]

## 2013-11-06 NOTE — Progress Notes (Signed)
Upon assessment pt has a foley catheter that was placed at [facility] prior to arriving at [facility] on 12/9 (according to the note on the foley bag). There is no written indication for foley catheter at this time. Foley catheter removed.

## 2013-11-07 NOTE — Progress Notes (Signed)
PULMONARY  / CRITICAL CARE MEDICINE  Name: Brandy [name] MRN: [id number] DOB: September 29, 1946    ADMISSION DATE:  10/27/2013 CONSULTATION DATE:  10/27/2013  REFERRING MD :  Maryruth Bun ICU PRIMARY SERVICE:  PCCM  CHIEF COMPLAINT:  Acute respiratory failure  BRIEF PATIENT DESCRIPTION: 67 yo with past medical history of COPD and CHF transferred from [facility] after being admitted x 2 weeks for acute respiratory failure.  Extubated 12/08.  Re intubated 12/09.  Course was complicated by right  pneumothorax requiring chest tube placement.  SIGNIFICANT EVENTS / STUDIES:  11/30  Admitted to [facility] with acute respiratory failure, failed BiPAP, intubated 12/08  Extubated 12/09  Re intubated 12/09  Pneumothorax / R chest tube placed 12/10 Transferred to [facility] 12/13 CT chest>>> R ptx 25-30%, lung tethered to pleural surface in multiple locations, R>L effusion, LLL PNA, stable RLL nodule ; TCTS consulted 12/15 borderline BP 12/16 trach 12/18 - pressors off / on / off  LINES / TUBES: ETT 12/09 >> 12/12, 12/12>>>12/16 12/16 trach (df)>>> R [place] CVL 12/09 >>  R chest tube 12/09 >>   CULTURES:  ANTIBIOTICS:  INTERVAL HISTORY/  Subjective somwhat agitated this am/ double clutching on the vent    VITAL SIGNS: Temp:  [98.3 F (36.8 C)-98.8 F (37.1 C)] 98.7 F (37.1 C) (12/21 1229) Pulse Rate:  [83-122] 102 (12/21 1229) Resp:  [11-46] 33 (12/21 1229) BP: (105-150)/(47-115) 110/47 mmHg (12/21 1229) SpO2:  [93 %-100 %] 100 % (12/21 0500) FiO2 (%):  [40 %-60 %] 40 % (12/21 1229) Weight:  [88 lb 10 oz (40.2 kg)] 88 lb 10 oz (40.2 kg) (12/21 0500) HEMODYNAMICS:   VENTILATOR SETTINGS: Vent Mode:  [-] PRVC FiO2 (%):  [40 %-60 %] 40 % Set Rate:  [12 bmp] 12 bmp Vt Set:  [400 mL] 400 mL PEEP:  [5 cmH20] 5 cmH20 Plateau Pressure:  [22 cmH20-32 cmH20] 25 cmH20  INTAKE / OUTPUT: Intake/Output     12/20 0701 - 12/21 0700 12/21 0701 - 12/22 0700   I.V. (mL/kg) 1200 (29.9)    NG/GT 930     Total Intake(mL/kg) 2130 (53)    Urine (mL/kg/hr)     Chest Tube 650 (0.7)    Total Output 650     Net +1480          Urine Occurrence 7 x    Stool Occurrence 8 x      PHYSICAL EXAMINATION: General: restless, having trouble communicating needs  Neuro:  follows simple commands HEENT:  mm dry  Cardiovascular: RRR s M Lungs: cta, no crepitus Abdomen:  Soft, nontender, +BS Ext: no edema  LABS:  Recent Labs Lab 11/02/13 0400 11/03/13 0410 11/05/13 0445  HGB 8.1* 8.6* 8.7*  HCT 24.4* 25.5* 27.0*  WBC 8.2 9.6 8.3  PLT 207 240 264    Recent Labs Lab 11/02/13 0400 11/03/13 0410 11/04/13 0430 11/05/13 0445 11/06/13 0530  NA 141 139 138 137 133*  K 4.0 4.0 3.9 4.3 3.7  CL 111 109 108 106 99  CO2 25 25 25 26 27   GLUCOSE 76 109* 139* 125* 183*  BUN 21 19 21 22  28*  CREATININE 1.01 1.02 1.04 0.96 0.93  CALCIUM 8.1* 8.1* 8.1* 8.7 8.7    Recent Labs Lab 11/02/13 0400  INR 1.05    CXR: 12/20 1. Stable support apparatus. Small residual right apical  pneumothorax.  2. Slightly improved aeration of the lung bases. Some of this  probably represents positional shift.   ASSESSMENT /  PLAN:  PULMONARY A:  Acute on chronic respiratory failure - failed extubation x 2  AECOPD.  R PTX with persistent air leak, improved 12/16 P:   Chest tube per cvts  Wean ps as tol to T collar Discussed with RT 12/21 resetting the insp trigger to prevent double clutching  CARDIOVASCULAR A: Borderline BP, inaccurate cuff?, r/o rel AI Had cortisol 10 on 10th, repeat = 17.8  12/19  P:  Tele        RENAL A:  Hypokalemia resolved P:    kvo   GASTROINTESTINAL A:  Severe protein calorie malnutrition Risk of refeeding syndrome no longer P:   SUP: famotidine    HEMATOLOGIC A:  Anemia without acute blood loss  Recent Labs Lab 11/02/13 0400 11/03/13 0410 11/05/13 0445  HGB 8.1* 8.6* 8.7*    P:  DVT px: sq heparin     INFECTIOUS A:  No definite acute  infection P:   ABX for peg only per IR  ENDOCRINE  A: Hypoglycemia from TF held P:   Monitor glu on chem panels    NEUROLOGIC A:  Acute encephalopathy History of alcohol abuse P:    Continue respiridol and seroquel for now     Sandrea Hughs, MD Pulmonary and Critical Care Medicine [facility] Cell [phone] After 5:30 PM or weekends, call [phone]

## 2013-11-08 ENCOUNTER — Inpatient Hospital Stay (HOSPITAL_COMMUNITY): Payer: Medicare Other

## 2013-11-08 LAB — CBC
Hemoglobin: 8.7 g/dL — ABNORMAL LOW (ref 12.0–15.0)
MCH: 27.4 pg (ref 26.0–34.0)
MCHC: 32.2 g/dL (ref 30.0–36.0)
MCV: 84.9 fL (ref 78.0–100.0)
RDW: 16.3 % — ABNORMAL HIGH (ref 11.5–15.5)

## 2013-11-08 MED ORDER — HYDROCORTISONE SOD SUCCINATE 100 MG IJ SOLR
25.0000 mg | Freq: Four times a day (QID) | INTRAMUSCULAR | Status: DC
  Administered 2013-11-08 – 2013-11-12 (×17): 25 mg via INTRAVENOUS
  Filled 2013-11-08 (×20): qty 0.5

## 2013-11-08 MED ORDER — RISPERIDONE 1 MG/ML PO SOLN
2.0000 mg | Freq: Two times a day (BID) | ORAL | Status: DC
  Administered 2013-11-09 – 2013-11-16 (×14): 2 mg via ORAL
  Filled 2013-11-08 (×17): qty 2

## 2013-11-08 MED ORDER — HEPARIN SODIUM (PORCINE) 5000 UNIT/ML IJ SOLN
5000.0000 [IU] | Freq: Three times a day (TID) | INTRAMUSCULAR | Status: DC
  Administered 2013-11-08 – 2013-11-16 (×22): 5000 [IU] via SUBCUTANEOUS
  Filled 2013-11-08 (×26): qty 1

## 2013-11-08 NOTE — Progress Notes (Signed)
Right pleural tube D/C'd per MD order without complication. Vassoline guaze applied with removal. Sutures tied with small area of insertion site noted to remain open. Moderate amount of serous drainage noted from site. Chest X-ray order placed.

## 2013-11-08 NOTE — Progress Notes (Signed)
Patient ID: Brandy [name], female   DOB: August 24, 1946, 67 y.o.   MRN: [id number] Perc gastrostomy tube postponed until 12/23 secondary to pt receiving heparin injection today.

## 2013-11-08 NOTE — Progress Notes (Addendum)
NUTRITION FOLLOW UP  DOCUMENTATION CODES  Per approved criteria   -Severe malnutrition in the context of chronic illness    Intervention:    Once G-tube ready to be used, resume Vital AF 1.2 formula at 15 ml/hr and increase by 10 ml every 4 hours back to goal rate of 35 ml/hr to provide 1008 kcals, 63 gm protein, 681 ml free water RD to follow for nutrition care plan  Nutrition Dx:   Inadequate oral intake related to inability to eat as evidenced by NPO status, ongoing  Goal:   EN to meet > 90% of estimated nutrition needs, currently unmet  Monitor:   EN regimen & tolerance, respiratory status, weight, labs, I/O's  Assessment:   67 yo with past medical history of COPD and CHF transferred from [facility] on 12/10 after being admitted x 2 weeks for acute respiratory failure. Intubated on 11/30, extubated on 12/08. Re intubated 12/09. Course was complicated by pneumothorax requiring chest tube placement.  Patient s/p procedure 12/18: PERC TRACHEOSTOMY   Patient transferred to 2C-Stepdown from [facility] ICU 12/19.  Patient is currently on ventilator support  MV: 7.6 L/min Temp (24hrs), Avg:98.8 F (37.1 C), Min:98.6 F (37 C), Max:99 F (37.2 C)   Patient's ordered EN regimen: Vital AF 1.2 at 35 ml/hr which provides 1008 kcals, 63 gm protein, 681 ml free water daily -- currently off for G-tube placement per IR.  Disposition: vent SNF.  Height: Ht Readings from Last 1 Encounters:  10/27/13 5\' 1"  (1.549 m)    Weight Status ---> stable Wt Readings from Last 1 Encounters:  11/08/13 88 lb 10 oz (40.2 kg)    12/21 88 lb 12/13 88 lb 12/12 88 lb 12/11 84 lb 12/10 92 lb  Re-estimated needs:  Kcal: 1050-1200 Protein: 55-65 gm  Fluid: >/= 1.5 L  Skin: Intact  Diet Order: NPO   Intake/Output Summary (Last 24 hours) at 11/08/13 1139 Last data filed at 11/08/13 1100  Gross per 24 hour  Intake   1525 ml  Output    350 ml  Net   1175 ml    Labs:   Recent  Labs Lab 11/04/13 0430 11/05/13 0445 11/06/13 0530  NA 138 137 133*  K 3.9 4.3 3.7  CL 108 106 99  CO2 25 26 27   BUN 21 22 28*  CREATININE 1.04 0.96 0.93  CALCIUM 8.1* 8.7 8.7  GLUCOSE 139* 125* 183*    CBG (last 3)   Recent Labs  11/05/13 1140  GLUCAP 115*    Scheduled Meds: . antiseptic oral rinse  1 application Mouth Rinse QID  . arformoterol  15 mcg Nebulization BID  . budesonide  0.25 mg Nebulization Q6H  . chlorhexidine  15 mL Mouth/Throat BID  . clonazePAM  0.5 mg Oral BID  . famotidine  20 mg Per Tube Daily  . feeding supplement (VITAL AF 1.2 CAL)  1,000 mL Per Tube Q24H  . heparin subcutaneous  5,000 Units Subcutaneous Q8H  . hydrocortisone sodium succinate  50 mg Intravenous Q6H  . QUEtiapine  50 mg Oral BID  . risperiDONE  1 mg Oral BID    Continuous Infusions: . dextrose 50 mL/hr at 11/07/13 2200    Maureen Chatters, RD, LDN Pager #: [phone] After-Hours Pager #: [phone]

## 2013-11-08 NOTE — Progress Notes (Signed)
PULMONARY  / CRITICAL CARE MEDICINE  Name: Brandy [name] MRN: [id number] DOB: 23-May-1946    ADMISSION DATE:  10/27/2013 CONSULTATION DATE:  10/27/2013  REFERRING MD :  Maryruth Bun ICU PRIMARY SERVICE:  PCCM  CHIEF COMPLAINT:  Acute respiratory failure  BRIEF PATIENT DESCRIPTION: 67 yo with past medical history of COPD and CHF transferred from [facility] after being admitted x 2 weeks for acute respiratory failure.  Extubated 12/08.  Re intubated 12/09.  Course was complicated by right  pneumothorax requiring chest tube placement.  SIGNIFICANT EVENTS / STUDIES:  11/30  Admitted to [facility] with acute respiratory failure, failed BiPAP, intubated 12/08  Extubated 12/09  Re intubated 12/09  Pneumothorax / R chest tube placed 12/10 Transferred to [facility] 12/13 CT chest>>> R ptx 25-30%, lung tethered to pleural surface in multiple locations, R>L effusion, LLL PNA, stable RLL nodule ; TCTS consulted 12/15 borderline BP 12/16 trach 12/18 - pressors off / on / off  LINES / TUBES: ETT 12/09 >> 12/12, 12/12>>>12/16 12/16 trach (df)>>> R [facility] CVL 12/09 >>  R chest tube 12/09 >>   CULTURES:  ANTIBIOTICS:  INTERVAL HISTORY/  Subjective Poor weaning efforts  VITAL SIGNS: Temp:  [98.6 F (37 C)-99 F (37.2 C)] 98.6 F (37 C) (12/22 0742) Pulse Rate:  [86-130] 109 (12/22 1110) Resp:  [12-41] 18 (12/22 1110) BP: (107-162)/(47-102) 120/76 mmHg (12/22 1110) SpO2:  [95 %-100 %] 100 % (12/22 1110) FiO2 (%):  [40 %] 40 % (12/22 1110) Weight:  [40.2 kg (88 lb 10 oz)] 40.2 kg (88 lb 10 oz) (12/22 0422) HEMODYNAMICS:   VENTILATOR SETTINGS: Vent Mode:  [-] PRVC FiO2 (%):  [40 %] 40 % Set Rate:  [12 bmp] 12 bmp Vt Set:  [400 mL] 400 mL PEEP:  [5 cmH20] 5 cmH20 Pressure Support:  [5 cmH20] 5 cmH20 Plateau Pressure:  [13 cmH20-22 cmH20] 13 cmH20  INTAKE / OUTPUT: Intake/Output     12/21 0701 - 12/22 0700 12/22 0701 - 12/23 0700   I.V. (mL/kg) 800 (19.9) 200 (5)   NG/GT 525     Total Intake(mL/kg) 1325 (33) 200 (5)   Chest Tube 350    Total Output 350     Net +975 +200        Urine Occurrence 6 x 1 x   Stool Occurrence 3 x 1 x     PHYSICAL EXAMINATION: General: agitation Neuro:  follows simple commands HEENT:  mm dry  Cardiovascular: RRR s M Lungs: cta Abdomen:  Soft, nontender, +BS, no r/g Ext: no edema  LABS:  Recent Labs Lab 11/03/13 0410 11/05/13 0445 11/08/13 0545  HGB 8.6* 8.7* 8.7*  HCT 25.5* 27.0* 27.0*  WBC 9.6 8.3 11.6*  PLT 240 264 368    Recent Labs Lab 11/02/13 0400 11/03/13 0410 11/04/13 0430 11/05/13 0445 11/06/13 0530  NA 141 139 138 137 133*  K 4.0 4.0 3.9 4.3 3.7  CL 111 109 108 106 99  CO2 25 25 25 26 27   GLUCOSE 76 109* 139* 125* 183*  BUN 21 19 21 22  28*  CREATININE 1.01 1.02 1.04 0.96 0.93  CALCIUM 8.1* 8.1* 8.1* 8.7 8.7    Recent Labs Lab 11/02/13 0400  INR 1.05    CXR:    ASSESSMENT / PLAN:  PULMONARY A:  Acute on chronic respiratory failure - failed extubation x 2  AECOPD.  R PTX with persistent air leak, improved 12/16 No leak P:   Chest tube no leak x days, will  pcxr now, if no sig change in small pleural line, will dc ct  Repeat PS with increase 10-12 No further having extra double insp effort noted  CARDIOVASCULAR A: Borderline BP, inaccurate cuff?, r/o rel AI Had cortisol 10 on 10th, repeat = 17.8  12/19  P:  Tele  RENAL A:  Hypokalemia resolved P:    kvo  Low threshold lasix Chem in am   GASTROINTESTINAL A:  Severe protein calorie malnutrition Risk of refeeding syndrome no longer P:   SUP: famotidine  for peg today  HEMATOLOGIC A:  Anemia without acute blood loss  Recent Labs Lab 11/03/13 0410 11/05/13 0445 11/08/13 0545  HGB 8.6* 8.7* 8.7*    P:  DVT px: sq heparin  INFECTIOUS A:  No definite acute infection P:   ABX for peg only per IR  ENDOCRINE  A: Hypoglycemia from TF held P:   Monitor glu on chem panels   NEUROLOGIC A:  Acute  encephalopathy History of alcohol abuse P:    Continue respiridol and increase as we dc seroquel   Mcarthur Rossetti. Tyson Alias, MD, FACP Pgr: [phone] [facility] '

## 2013-11-08 NOTE — Progress Notes (Addendum)
CSW received call from [facility] stating they have beds available and wondering when we are looking at discharge. CSW called MD, who states pt is getting a PEG tomorrow but can go after this procedure. CSW called Misty Stanley in [place] at vent SNF and left a voicemail with this information, that she can discharge tomorrow if they can take her. CSW awaiting return call.  Addendum: CSW also followed up with an email to [facility] explaining pt can discharge tomorrow after PEG if they have a bed for pt. CSW asked Misty Stanley either e-mail CSW back or call and leave a voicemail if they are able to take pt tomorrow. CSW will follow up with [facility] tomorrow morning.  Maryclare Labrador, MSW, [facility] Clinical Social Worker [phone]

## 2013-11-08 NOTE — Progress Notes (Addendum)
Clinicals submitted to [facility] for insurance authorization for vent SNF.  Addendum: CSW called [employer] pre-certification department to speak with case manager handling pt case. CSW put on hold and hung up after 15 minutes. CSW will re-attempt at another time. CSW submitted clinicals to [facility] in [place], [facility] in [place], [facility] in [place], and [facility] in [place].  Addendum: CSW spoke with Loraine Leriche, rep with [employer]. Loraine Leriche explained that a specific case manager is no longer assigned to patients and that clinicals are reviewed by "whichever provider picks it up" as the documents funnel through the pipeline. CSW asked how to contact the appropriate provider who is reviewing the documents and Loraine Leriche told CSW to call [phone] and ask for updates.  Addendum: CSW called pt's son [name], who has been designated Clinical research associate, and informed him that clinicals have been sent to the 3 facilities in [facility] and [facility] in [place]. Lorenzo understanding. CSW also explained that it might be necessary to expand the search to facilities further away, but at this time CSW is searching at these 4 closest facilities and will inform [name] as facilities respond to bed request.  Maryclare Labrador, MSW, [facility] Clinical Social Worker [phone]

## 2013-11-09 ENCOUNTER — Inpatient Hospital Stay (HOSPITAL_COMMUNITY): Payer: Medicare Other

## 2013-11-09 ENCOUNTER — Encounter (HOSPITAL_COMMUNITY): Payer: Self-pay | Admitting: Radiology

## 2013-11-09 LAB — URINALYSIS, ROUTINE W REFLEX MICROSCOPIC
Ketones, ur: NEGATIVE mg/dL
Leukocytes, UA: NEGATIVE
Nitrite: NEGATIVE
Protein, ur: NEGATIVE mg/dL
Urobilinogen, UA: 0.2 mg/dL (ref 0.0–1.0)

## 2013-11-09 LAB — BASIC METABOLIC PANEL
Calcium: 8.3 mg/dL — ABNORMAL LOW (ref 8.4–10.5)
Creatinine, Ser: 0.79 mg/dL (ref 0.50–1.10)
GFR calc non Af Amer: 84 mL/min — ABNORMAL LOW (ref >90)
Glucose, Bld: 102 mg/dL — ABNORMAL HIGH (ref 70–99)
Potassium: 2.9 mEq/L — ABNORMAL LOW (ref 3.5–5.1)
Sodium: 142 mEq/L (ref 135–145)

## 2013-11-09 LAB — CBC
MCH: 27.7 pg (ref 26.0–34.0)
MCHC: 32.5 g/dL (ref 30.0–36.0)
MCV: 85.1 fL (ref 78.0–100.0)
Platelets: 329 10*3/uL (ref 150–400)
RBC: 2.82 MIL/uL — ABNORMAL LOW (ref 3.87–5.11)
RDW: 16.6 % — ABNORMAL HIGH (ref 11.5–15.5)

## 2013-11-09 LAB — PROCALCITONIN: Procalcitonin: 0.32 ng/mL

## 2013-11-09 MED ORDER — POTASSIUM CHLORIDE 10 MEQ/50ML IV SOLN
10.0000 meq | INTRAVENOUS | Status: AC
  Administered 2013-11-09 (×3): 10 meq via INTRAVENOUS
  Filled 2013-11-09 (×7): qty 50

## 2013-11-09 MED ORDER — CLONAZEPAM 0.5 MG PO TABS: 0.5000 mg | ORAL_TABLET | Freq: Two times a day (BID) | ORAL | Status: DC

## 2013-11-09 MED ORDER — VITAL AF 1.2 CAL PO LIQD: 1000.0000 mL | ORAL | Status: AC

## 2013-11-09 MED ORDER — BIOTENE DRY MOUTH MT LIQD: 1.0000 "application " | Freq: Four times a day (QID) | OROMUCOSAL | Status: AC

## 2013-11-09 MED ORDER — RISPERIDONE 1 MG/ML PO SOLN: 2.0000 mg | Freq: Two times a day (BID) | ORAL | Status: AC

## 2013-11-09 MED ORDER — FENTANYL CITRATE 0.05 MG/ML IJ SOLN
INTRAMUSCULAR | Status: AC
  Filled 2013-11-09: qty 2

## 2013-11-09 MED ORDER — POTASSIUM CHLORIDE 10 MEQ/50ML IV SOLN
INTRAVENOUS | Status: AC
  Filled 2013-11-09: qty 50

## 2013-11-09 MED ORDER — GLUCAGON HCL (RDNA) 1 MG IJ SOLR
INTRAMUSCULAR | Status: AC | PRN
  Administered 2013-11-09: 1 mg via INTRAVENOUS

## 2013-11-09 MED ORDER — POTASSIUM CHLORIDE 20 MEQ/15ML (10%) PO LIQD: 40.0000 meq | Freq: Every day | ORAL | Status: AC

## 2013-11-09 MED ORDER — FENTANYL CITRATE 0.05 MG/ML IJ SOLN
INTRAMUSCULAR | Status: AC | PRN
  Administered 2013-11-09: 25 ug via INTRAVENOUS

## 2013-11-09 MED ORDER — BUDESONIDE 0.25 MG/2ML IN SUSP: 0.2500 mg | Freq: Four times a day (QID) | RESPIRATORY_TRACT | Status: DC

## 2013-11-09 MED ORDER — ARFORMOTEROL TARTRATE 15 MCG/2ML IN NEBU: 15.0000 ug | INHALATION_SOLUTION | Freq: Two times a day (BID) | RESPIRATORY_TRACT | Status: AC

## 2013-11-09 MED ORDER — MIDAZOLAM HCL 2 MG/2ML IJ SOLN
INTRAMUSCULAR | Status: AC
  Filled 2013-11-09: qty 2

## 2013-11-09 MED ORDER — FENTANYL CITRATE 0.05 MG/ML IJ SOLN: 25.0000 ug | INTRAMUSCULAR | Status: DC | PRN

## 2013-11-09 MED ORDER — CHLORHEXIDINE GLUCONATE 0.12 % MT SOLN: 15.0000 mL | Freq: Two times a day (BID) | OROMUCOSAL | Status: AC

## 2013-11-09 MED ORDER — GLUCAGON HCL (RDNA) 1 MG IJ SOLR
INTRAMUSCULAR | Status: AC
  Filled 2013-11-09: qty 1

## 2013-11-09 MED ORDER — HEPARIN SODIUM (PORCINE) 5000 UNIT/ML IJ SOLN: 5000.0000 [IU] | Freq: Three times a day (TID) | INTRAMUSCULAR | Status: AC

## 2013-11-09 MED ORDER — IOHEXOL 300 MG/ML  SOLN
50.0000 mL | Freq: Once | INTRAMUSCULAR | Status: AC | PRN
  Administered 2013-11-09: 15 mL

## 2013-11-09 MED ORDER — CEFAZOLIN SODIUM-DEXTROSE 2-3 GM-% IV SOLR
2.0000 g | Freq: Once | INTRAVENOUS | Status: AC
  Administered 2013-11-09: 2 g via INTRAVENOUS
  Filled 2013-11-09: qty 50

## 2013-11-09 MED ORDER — FAMOTIDINE 40 MG/5ML PO SUSR: 20.0000 mg | Freq: Every day | ORAL | Status: AC

## 2013-11-09 MED ORDER — POTASSIUM CHLORIDE 10 MEQ/50ML IV SOLN
10.0000 meq | INTRAVENOUS | Status: AC
  Administered 2013-11-09 (×3): 10 meq via INTRAVENOUS
  Filled 2013-11-09: qty 50

## 2013-11-09 MED ORDER — ALBUTEROL SULFATE (5 MG/ML) 0.5% IN NEBU: 2.5000 mg | INHALATION_SOLUTION | RESPIRATORY_TRACT | Status: AC | PRN

## 2013-11-09 MED ORDER — MIDAZOLAM HCL 2 MG/2ML IJ SOLN
INTRAMUSCULAR | Status: AC | PRN
  Administered 2013-11-09: 1 mg via INTRAVENOUS

## 2013-11-09 MED ORDER — DEXTROSE 5 % IV SOLN: INTRAVENOUS | Status: AC

## 2013-11-09 MED ORDER — HYDROCORTISONE SOD SUCCINATE 100 MG IJ SOLR: 25.0000 mg | Freq: Four times a day (QID) | INTRAMUSCULAR | Status: DC

## 2013-11-09 NOTE — Progress Notes (Signed)
Stacey in admissions at [facility] vent SNF is submitting for insurance authorization from [facility] this morning and will alert CSW when she has gotten this. Pt can then discharge to [facility] vent SNF. Pt is getting a PEG today and can be discharged today if MD thinks this is appropriate.    Maryclare Labrador, MSW, [facility] Clinical Social Worker [phone]

## 2013-11-09 NOTE — Progress Notes (Addendum)
CSW got call from admissions at [facility]; Misty Stanley still has not heard from [facility]. CSW called [facility] and arranged for transportation for tomorrow around noon, at [facility] request. Misty Stanley has left [employer] two voicemails and has not gotten insurance auth. If Misty Stanley gets insurance auth today pt will discharge to [facility] today. If Misty Stanley gets it tomorrow, pt will discharge to [facility] around noon tomorrow. CSW updated RN, who is going to inform pt/family. Misty Stanley will call CSW if she needs transportation cancelled/if Berkley Harvey is given today.  Addendum: pt going to room 301A; receiving MD: Angelina Ok.  Maryclare Labrador, MSW, [facility] Clinical Social Worker [phone]

## 2013-11-09 NOTE — Progress Notes (Signed)
CSW called Misty Stanley with [facility] vent SNF and asked if she has insurance auth. Misty Stanley says she shouldn't have any problem getting it, that she will call the rep with [facility] after she is out of a meeting in an hour or so. Pt is getting a PEG this afternoon and can discharge right after getting this. CSW will facilitate discharge after Misty Stanley has called back with SNF authorization.   Maryclare Labrador, MSW, [facility] Clinical Social Worker [phone]

## 2013-11-09 NOTE — Progress Notes (Signed)
eLink Physician-Brief Progress Note Patient Name: Brandy [name] DOB: 1946/10/21 MRN: [id number]  Date of Service  11/09/2013   HPI/Events of Note  Patient with temp of 101.9 with slight elevation in WBC form 8.3 to 11.6 on no ABX.  Slight hazyness of left lung base on prior PCXR.  HD stable.   eICU Interventions  Plan: Blood/urine/trach aspirate cultures Monitor temp PCXR Hold ABX for now   Intervention Category Minor Interventions: Clinical assessment - ordering diagnostic tests  DETERDING,ELIZABETH 11/09/2013, 2:03 AM

## 2013-11-09 NOTE — Discharge Summary (Cosign Needed)
Physician Discharge Summary       Patient ID: Brandy [name] MRN: [id number] DOB/AGE: 12/20/1945 67 y.o.  Admit date: 10/27/2013 Discharge date: 11/09/2013  Discharge Diagnoses:  Active Problems:   Acute respiratory failure   Pneumothorax   Bronchopleural fistula   COPD (chronic obstructive pulmonary disease)   Encephalopathy acute   Protein-calorie malnutrition, severe   Obstructive chronic bronchitis with exacerbation  Detailed Hospital Course:   67 yo with past medical history of COPD and CHF transferred from [facility] after being admitted x 2 weeks for acute respiratory failure. Extubated 12/08. Re intubated 12/09. Course was complicated by pneumothorax requiring chest tube placement. Transferred to [facility] for further care. Hospital course high-lights are as follows: 12/10 Transferred to [facility] 12/13 CT chest>>> R ptx 25-30%, lung tethered to pleural surface in multiple locations, R>L effusion, LLL PNA, stable RLL nodule ; TCTS consulted: recommended increased suction to CT to 30 cm H2O.  The pneumothorax eventually resolved, there was no airleak noted in drainage system and the patient was monitored to ensure CXR was stable. Had decreased blood pressure requiring pressors on 12/15.  She underwent trach on 12/16 for failure to wean. 12/18 - pressors off / on / off. Chest tube was removed on 12/22. As of 12/23 she has a stable CXR. There may be a small apical right PTX but this is unchanged from prior films even when CT was in place. Had PEG tube placed on 12/23 by interventional radiology. As of 12/23 she is cleared for d/c to [facility] setting with the following directions as listed below per active dx   Discharge Plan by diagnoses   1) Acute on chronic respiratory failure due to AECOPD. Complicated by - failed extubation x 2, and right pneumothorax at outside hospital  2) Tracheostomy status  Recommendation -initiate weaning protocol per LTAC -routine trach care -f/u CXR. Had  small isolated apical PTX vs skin fold. This is unchanged on subsequent films and recommend XRAY follow up as well as clinical evaluation. No role for Chest tube.  -Consider neg balance , lasix, await better BP  - remove CT sutures on 12/27  Borderline BP Relative adrenal insufficient  Had cortisol 10 on 10th, repeat and add stress roids empiric  Recommendation  -taper steroids over next 5-7d  Severe protein calorie malnutrition  PEG placed 12/23 Recommendations -initiate tube feeds on 12/24, ok to resume at goal rate  -may administer meds via PEG today 12/23 -PEG site care   Anemia without acute blood loss  Recommendations  Cbc in am  Continue prophylactic dose heparin   Acute encephalopathy  History of alcohol abuse  Recommendations  Continue rispirdal, may need increase   Fever 12/23 Pan-cultured Procalcitonin was: negative and not suggestive of active infection  Recommendations: Continue trend fever curve and WBC ct Significant Hospital tests/ studies/ interventions and procedures  Consults Thoracic surgery Low threshold for abx   11/30 Admitted to [facility] with acute respiratory failure, failed BiPAP, intubated  12/08 Extubated  12/09 Re intubated  12/09 Pneumothorax / R chest tube placed  12/10 Transferred to [facility]  12/13 CT chest>>> R ptx 25-30%, lung tethered to pleural surface in multiple locations, R>L effusion, LLL PNA, stable RLL nodule ; TCTS consulted  12/15 borderline BP  12/16 trach  12/18 - pressors off / on / off  12/22: spiked fever. CXR was better. Pan cultured, but procalcitonin is negative  12/23 PEG placed   LINES / TUBES:  ETT 12/09 >> 12/12, 12/12>>>12/16  12/16 trach (df)>>>  R [place] CVL 12/09 >>  R chest tube 12/09 >> 12/22 PEG tube placed 12/23 by IR   CULTURES:  UC 12/23>>> BCX2 12/23>>> Sputum 12/23>>>  ANTIBIOTICS:  Discharge Exam: BP 155/77  Pulse 95  Temp(Src) 100.6 F (38.1 C) (Oral)  Resp 10  Ht 5\' 1"  (1.549 m)  Wt  40.2 kg (88 lb 10 oz)  BMI 16.75 kg/m2  SpO2 100%  PHYSICAL EXAMINATION:  General: no distress  Neuro: rass 1 , follows simple commands  HEENT:trach unremarkable cardiovascular: RRR s M  Lungs: cta, no crepitus CT dressing intact. CT site w/out drainage.  Abdomen: Soft, nontender, +BS  Ext: no edema   Labs at discharge Lab Results  Component Value Date   CREATININE 0.79 11/09/2013   BUN 19 11/09/2013   NA 142 11/09/2013   K 2.9* 11/09/2013   CL 97 11/09/2013   CO2 38* 11/09/2013   Lab Results  Component Value Date   WBC 9.2 11/09/2013   HGB 7.8* 11/09/2013   HCT 24.0* 11/09/2013   MCV 85.1 11/09/2013   PLT 329 11/09/2013   Lab Results  Component Value Date   ALT 28 10/27/2013   AST 29 10/27/2013   ALKPHOS 59 10/27/2013   BILITOT 0.7 10/27/2013   Lab Results  Component Value Date   INR 1.05 11/02/2013   INR 1.26 10/27/2013    Current radiology studies Ir Gastrostomy Tube Mod Sed  11/09/2013   CLINICAL DATA:  Respiratory failure, ventilatory support, malnutrition  EXAM: 20 FRENCH PULL-THROUGH GASTROSTOMY WITH FLUOROSCOPIC GUIDANCE  Date:  12/23/201412/23/2014 2:38 PM  Radiologist:  Judie Petit. Ruel Favors, MD  Guidance:  FLUOROSCOPIC  MEDICATIONS AND MEDICAL HISTORY: 1 g and Sethadministered within 1 hour of the procedure,1 mg Versed, 25 mcg fentanyl  ANESTHESIA/SEDATION: 10 min  CONTRAST:  15mL OMNIPAQUE IOHEXOL 300 MG/ML  SOLN  FLUOROSCOPY TIME:  1 MIN  PROCEDURE: Informed consent was obtained from the patient following explanation of the procedure, risks, benefits and alternatives. The patient understands, agrees and consents for the procedure. All questions were addressed. A time out was performed.  Maximal barrier sterile technique utilized including caps, mask, sterile gowns, sterile gloves, large sterile drape, hand hygiene, and betadine prep.  The left upper quadrant was sterilely prepped and draped. An oral gastric catheter was inserted into the stomach under  fluoroscopy. The existing nasogastric feeding tube was removed. Air was injected into the stomach for insufflation and visualization under fluoroscopy. The air distended stomach was confirmed beneath the anterior abdominal wall in the frontal and lateral projections. Under sterile conditions and local anesthesia, a 17 gauge trocar needle was utilized to access the stomach percutaneously beneath the left subcostal margin. Needle position was confirmed within the stomach under biplane fluoroscopy. Contrast injection confirmed position also. A single T tack was deployed for gastropexy. Over an Amplatz guide wire, a 9-French sheath was inserted into the stomach. A snare device was utilized to capture the oral gastric catheter. The snare device was pulled retrograde from the stomach up the esophagus and out the oropharynx. The 20-French pull-through gastrostomy was connected to the snare device and pulled antegrade through the oropharynx down the esophagus into the stomach and then through the percutaneous tract external to the patient. The gastrostomy was assembled externally. Contrast injection confirms position in the stomach. Images were obtained for documentation. The patient tolerated procedure well. No immediate complication.  COMPLICATIONS: No immediate  IMPRESSION: Fluoroscopic insertion of a 20-French "pull-through" gastrostomy.   Electronically  Signed   By: Ruel Favors M.D.   On: 11/09/2013 14:44   Dg Chest Port 1 View  11/09/2013   CLINICAL DATA:  Chest tube removal.  EXAM: PORTABLE CHEST - 1 VIEW  COMPARISON:  11/08/2013.  FINDINGS: Interim removal of chest tube on right. Very tiny right apical pneumothorax cannot be excluded. Tracheostomy and central line in stable position. Mild atelectatic changes are noted in in the right perihilar and left perihilar regions. Small bilateral pleural effusions are noted. COPD. Heart size and pulmonary vascularity normal. No acute osseous abnormality.  IMPRESSION: 1.  Interim removal of right chest tube. A very tiny right apical pneumothorax cannot be excluded. 2. Tracheostomy and central line in stable position. 3. Bilateral perihilar subsegmental atelectasis. 4. Small bilateral pleural effusions.   Electronically Signed   By: Maisie Fus  Register   On: 11/09/2013 07:06   Dg Chest Port 1 View  11/08/2013   CLINICAL DATA:  Reassess pneumothorax  EXAM: PORTABLE CHEST - 1 VIEW  COMPARISON:  Chest x-ray of 06 November 2013.  FINDINGS: The tiny right pneumothorax in the apex is not visible currently. The right-sided chest tube is unchanged in position. The left lung is well expanded. There is persistent hazy density at the left lung base with some obscuration of the lateral aspect of the left hemidiaphragm. A tracheostomy tube is in place and appears unchanged. The feeding tube appears to been withdrawn. The right subclavian venous catheter is unchanged with its tip in the region of the mid to distal SVC.  IMPRESSION: The pneumothorax on the right is not evident currently. No acute change since yesterday's study is demonstrated otherwise.   Electronically Signed   By: David  [place]   On: 11/08/2013 14:07   Dg Abd Portable 1v  11/08/2013   CLINICAL DATA:  Dysphagia  EXAM: PORTABLE ABDOMEN - 1 VIEW  COMPARISON:  November 05, 2013  FINDINGS: There is contrast throughout the colon. The colon does not obscure the stomach on this current examination. Overall bowel gas pattern is normal. No obstruction or free air seen on this supine examination.  IMPRESSION: Contrast throughout colon; stomach does not appear obscured, however.   Electronically Signed   By: Bretta Bang M.D.   On: 11/08/2013 09:37    Disposition: LTAC        Discharge Orders   Future Orders Complete By Expires   Diet - low sodium heart healthy  As directed    Increase activity slowly  As directed    Scheduling Instructions:     Vent Mode:  [-] PRVC FiO2 (%):  [40 %-100 %] 100 % Set Rate:  [12 bmp] 12  bmp Vt Set:  [400 mL] 400 mL PEEP:  [5 cmH20] 5 cmH20  Activity: Needs PT and OT consult  Diet NPO except meds today 12/23 Ok to resume tube feeds 12/23       Medication List    STOP taking these medications       budesonide-formoterol 80-4.5 MCG/ACT inhaler  Commonly known as:  SYMBICORT     COMBIVENT RESPIMAT 20-100 MCG/ACT Aers respimat  Generic drug:  Ipratropium-Albuterol     lisinopril-hydrochlorothiazide 20-12.5 MG per tablet  Commonly known as:  PRINZIDE,ZESTORETIC     lovastatin 20 MG tablet  Commonly known as:  MEVACOR      TAKE these medications       albuterol (5 MG/ML) 0.5% nebulizer solution  Commonly known as:  PROVENTIL  Take 0.5 mLs (2.5 mg total) by  nebulization every 2 (two) hours as needed for wheezing or shortness of breath.     antiseptic oral rinse Liqd  15 mLs by Mouth Rinse route QID.     arformoterol 15 MCG/2ML Nebu  Commonly known as:  BROVANA  Take 2 mLs (15 mcg total) by nebulization 2 (two) times daily.     budesonide 0.25 MG/2ML nebulizer solution  Commonly known as:  PULMICORT  Take 2 mLs (0.25 mg total) by nebulization every 6 (six) hours.     chlorhexidine 0.12 % solution  Commonly known as:  PERIDEX  Use as directed 15 mLs in the mouth or throat 2 (two) times daily.     clonazePAM 0.5 MG tablet  Commonly known as:  KLONOPIN  Take 1 tablet (0.5 mg total) by mouth 2 (two) times daily.     dextrose 5 % solution  50 ml/hr     famotidine 40 MG/5ML suspension  Commonly known as:  PEPCID  Place 2.5 mLs (20 mg total) into feeding tube daily.     feeding supplement (VITAL AF 1.2 CAL) Liqd  Place 1,000 mLs into feeding tube daily.     fentaNYL 0.05 MG/ML injection  Commonly known as:  SUBLIMAZE  Inject 0.5 mLs (25 mcg total) into the vein every 2 (two) hours as needed for severe pain.     heparin 5000 UNIT/ML injection  Inject 1 mL (5,000 Units total) into the skin every 8 (eight) hours.     hydrocortisone sodium  succinate 100 mg/2 mL injection  Commonly known as:  SOLU-CORTEF  Inject 0.5 mLs (25 mg total) into the vein every 6 (six) hours.     potassium chloride 20 MEQ/15ML (10%) solution  Place 30 mLs (40 mEq total) into feeding tube daily.     risperiDONE 1 MG/ML oral solution  Commonly known as:  RISPERDAL  Take 2 mLs (2 mg total) by mouth 2 (two) times daily.         Discharged Condition: slowly improving   Physician Statement:   The Patient was personally examined, the discharge assessment and plan has been personally reviewed and I agree with ACNP Ender Rorke's assessment and plan. > 30 minutes of time have been dedicated to discharge assessment, planning and discharge instructions.   Signed: Genese Quebedeaux,PETE 11/09/2013, 3:09 PM

## 2013-11-09 NOTE — Progress Notes (Signed)
CSW called pt's son [name] and left him a voicemail stating pt has a bed offer at [facility] and might be discharging today. CSW waiting to hear back from [facility] admissions with SNF authorization. Once CSW has this, and pt's PEG has been placed, CSW will facilitate discharge to [facility] SNF.   Maryclare Labrador, MSW, [facility] Clinical Social Worker [phone]

## 2013-11-09 NOTE — Progress Notes (Signed)
Pt in PEG procedure now. CSW called Misty Stanley in admissions at [facility] and [facility] waiting to hear concerning insurance auth. CSW informed Misty Stanley pt will be ready for discharge once discharge summary is complete and PEG is complete. CSW will call Misty Stanley again if CSW does not hear back from her before 4:00pm.   Maryclare Labrador, MSW, [facility] Clinical Social Worker [phone]

## 2013-11-09 NOTE — Progress Notes (Signed)
ADMISSION DATE:  10/27/2013 CONSULTATION DATE:  10/27/2013  REFERRING MD :  Maryruth Bun ICU PRIMARY SERVICE:  PCCM  CHIEF COMPLAINT:  Acute respiratory failure  BRIEF PATIENT DESCRIPTION: 67 yo with past medical history of COPD and CHF transferred from [facility] after being admitted x 2 weeks for acute respiratory failure.  Extubated 12/08.  Re intubated 12/09.  Course was complicated by right  pneumothorax requiring chest tube placement.  SIGNIFICANT EVENTS / STUDIES:  11/30  Admitted to [facility] with acute respiratory failure, failed BiPAP, intubated 12/08  Extubated 12/09  Re intubated 12/09  Pneumothorax / R chest tube placed 12/10 Transferred to [facility] 12/13 CT chest>>> R ptx 25-30%, lung tethered to pleural surface in multiple locations, R>L effusion, LLL PNA, stable RLL nodule ; TCTS consulted 12/15 borderline BP 12/16 trach 12/18 - pressors off / on / off  LINES / TUBES: ETT 12/09 >> 12/12, 12/12>>>12/16 12/16 trach (df)>>> R [place] CVL 12/09 >>  R chest tube 12/09 >>   CULTURES: UC 12/23>>> BCX2 12/23>>> Sputum 12/23>>>  ANTIBIOTICS:  INTERVAL HISTORY/  Subjective Poor weaning efforts  VITAL SIGNS: Temp:  [98.4 F (36.9 C)-101.9 F (38.8 C)] 99.2 F (37.3 C) (12/23 0800) Pulse Rate:  [97-122] 97 (12/23 0800) Resp:  [15-45] 19 (12/23 0800) BP: (112-184)/(68-94) 112/71 mmHg (12/23 0800) SpO2:  [95 %-100 %] 100 % (12/23 0800) FiO2 (%):  [40 %] 40 % (12/23 0819) Weight:  [40.2 kg (88 lb 10 oz)] 40.2 kg (88 lb 10 oz) (12/23 0400) HEMODYNAMICS:   VENTILATOR SETTINGS: Vent Mode:  [-] PRVC FiO2 (%):  [40 %] 40 % Set Rate:  [12 bmp] 12 bmp Vt Set:  [400 mL] 400 mL PEEP:  [5 cmH20] 5 cmH20 Pressure Support:  [12 cmH20] 12 cmH20 Plateau Pressure:  [11 cmH20-29 cmH20] 29 cmH20  INTAKE / OUTPUT: Intake/Output     12/22 0701 - 12/23 0700 12/23 0701 - 12/24 0700   I.V. (mL/kg) 1150 (28.6) 100 (2.5)   NG/GT     Total Intake(mL/kg) 1150 (28.6) 100 (2.5)    Urine (mL/kg/hr) 300 (0.3)    Chest Tube     Total Output 300     Net +850 +100        Urine Occurrence 6 x    Stool Occurrence 3 x      PHYSICAL EXAMINATION: General: agitation Neuro:  follows simple commands HEENT:  mm dry  Cardiovascular: RRR s M Lungs: exp wheeze/rhonchi CT out. CT site unremarkable  Abdomen:  Soft, nontender, +BS, no r/g Ext: no edema  LABS:  Recent Labs Lab 11/05/13 0445 11/08/13 0545 11/09/13 0412  HGB 8.7* 8.7* 7.8*  HCT 27.0* 27.0* 24.0*  WBC 8.3 11.6* 9.2  PLT 264 368 329    Recent Labs Lab 11/03/13 0410 11/04/13 0430 11/05/13 0445 11/06/13 0530 11/09/13 0412  NA 139 138 137 133* 142  K 4.0 3.9 4.3 3.7 2.9*  CL 109 108 106 99 97  CO2 25 25 26 27  38*  GLUCOSE 109* 139* 125* 183* 102*  BUN 19 21 22  28* 19  CREATININE 1.02 1.04 0.96 0.93 0.79  CALCIUM 8.1* 8.1* 8.7 8.7 8.3*   No results found for this basename: AST, ALT, ALKPHOS, BILITOT, PROT, ALBUMIN, INR,  in the last 168 hours  CXR: small loculated right apical PTX vs skin fold. Aeration improved on left    ASSESSMENT / PLAN:  PULMONARY A:  Acute on chronic respiratory failure - failed extubation x 2  AECOPD.  R PTX with persistent air leak, improved 12/16 No leak and CT removed 12/22 P: PS wean as tolerated, needs 18-20 attempts Will plan for LTAC after PEG Unsure if any even small apical ptx on pcxr, clinically well Would repeat pcxr daily  CARDIOVASCULAR A: Borderline BP, inaccurate cuff?, r/o rel AI Had cortisol 10 on 10th, repeat = 17.8  12/19  P:  Tele Slow taper roids   RENAL A:  Hypokalemia  P:    Replace and recheck KCL   GASTROINTESTINAL A:  Severe protein calorie malnutrition  P:   SUP: famotidine  for peg today  HEMATOLOGIC A:  Anemia without acute blood loss  Recent Labs Lab 11/05/13 0445 11/08/13 0545 11/09/13 0412  HGB 8.7* 8.7* 7.8*    P:  DVT px: sq heparin  INFECTIOUS A: fever 12/22. Cultures sent P:   Check PCT Follow  culture data Low threshold for abx. If PCT positive will start empiric HCAP coverage   ENDOCRINE  A: Hypoglycemia from TF held P:   Monitor glu on chem panels   NEUROLOGIC A:  Acute encephalopathy History of alcohol abuse P:    Continue respiridol    I have fully examined this patient and agree with above findings.    And edited in full  Mcarthur Rossetti. Tyson Alias, MD, FACP Pgr: [phone] [facility]

## 2013-11-09 NOTE — Progress Notes (Signed)
RT attempted to wean patient on 12/5 40%. Patient failed wean due to no respiratory effort. Patient returned back to full support and tolerating well. RT will continue to monitor.

## 2013-11-09 NOTE — Procedures (Signed)
Successful 24fr pull through Gtube  No comp Stable Ready for full use tomorrow Full report in pacs

## 2013-11-10 ENCOUNTER — Inpatient Hospital Stay (HOSPITAL_COMMUNITY): Payer: Medicare Other

## 2013-11-10 LAB — BASIC METABOLIC PANEL
BUN: 18 mg/dL (ref 6–23)
CO2: 37 mEq/L — ABNORMAL HIGH (ref 19–32)
Chloride: 93 mEq/L — ABNORMAL LOW (ref 96–112)
Creatinine, Ser: 0.76 mg/dL (ref 0.50–1.10)
GFR calc Af Amer: >90 mL/min (ref >90)
Glucose, Bld: 112 mg/dL — ABNORMAL HIGH (ref 70–99)
Potassium: 3.1 mEq/L — ABNORMAL LOW (ref 3.5–5.1)
Sodium: 137 mEq/L (ref 135–145)

## 2013-11-10 LAB — URINE CULTURE

## 2013-11-10 LAB — PHOSPHORUS: Phosphorus: 2 mg/dL — ABNORMAL LOW (ref 2.3–4.6)

## 2013-11-10 NOTE — Progress Notes (Addendum)
CSW called [employer] again and spoke with Molly Maduro, case Production designer, theatre/television/film with [employer], and Molly Maduro stated CSW should have an answer for insurance auth by the end of the workday today, as they are working to answer all authorization requests being reviewed today. CSW made sure they also have Stacey's number at [facility]. Robert confirmed. CSW will forward calls from work phone to personal phone if she does not hear from [facility] before shift is over at 1pm today, complete discharge packet ahead of time, alert RN of plan, and will call [facility] for transportation as soon as [employer] responds to insurance auth request.  Addendum: CSW instead of forwarding to personal phone called [facility] and provided on-call CSW name and number. This contact info has been updated. CSW will provide packet to RN and give instructions for discharge.  Maryclare Labrador, MSW, [facility] Clinical Social Worker [phone]

## 2013-11-10 NOTE — Progress Notes (Signed)
CSW called Misty Stanley and left a voicemail asking if she has heard from [facility] and the plan is to still discharge pt around noon via [facility]. CSW waiting on return call from [place].   Maryclare Labrador, MSW, [facility] Clinical Social Worker [phone]

## 2013-11-10 NOTE — Progress Notes (Signed)
Brandy [name], admissions with [facility], states Brandy [name] is reviewing pt's clinicals to determine insurance auth and CSW called Brandy [name] and left him a Engineer, technical sales. CSW called [facility] pre-certification department and after being put on hold CSW spoke with a representative who explained [employer] has up to one week to review clinicals before they make a decision concerning insurance auth. Rep verified that the clinicals have been received (yesterday) and are being reviewed. CSW asked to speak directly with the person reviewing the case, and was told this is not possible and that [facility] will call CSW when they make a decision. CSW also added Brandy [name]'s phone number to their contacts and asked them to call Brandy [name] with insurance auth as well. CSW has cancelled transportation and will reschedule once [employer] gives insurance auth.   Maryclare Labrador, MSW, [facility] Clinical Social Worker [phone]

## 2013-11-10 NOTE — Progress Notes (Signed)
CSW provided discharge packet to RN. RN to page MD to get [facility] signed. CSW called on-call CSW and informed her that we are waiting for [facility] auth. On-call CSW will call Misty Stanley at [facility] admissions if on-call CSW gets [facility] authorization. Then CSW will call unit RN and tell her to call [facility] for transportation.    Maryclare Labrador, MSW, [facility] Clinical Social Worker [phone]

## 2013-11-10 NOTE — Progress Notes (Signed)
ADMISSION DATE:  10/27/2013 CONSULTATION DATE:  10/27/2013  REFERRING MD :  Maryruth Bun ICU PRIMARY SERVICE:  PCCM  CHIEF COMPLAINT:  Acute respiratory failure  BRIEF PATIENT DESCRIPTION: 67 yo with past medical history of COPD and CHF transferred from [facility] after being admitted x 2 weeks for acute respiratory failure.  Extubated 12/08.  Re intubated 12/09.  Course was complicated by right  pneumothorax requiring chest tube placement.  SIGNIFICANT EVENTS / STUDIES:  11/30  Admitted to [facility] with acute respiratory failure, failed BiPAP, intubated 12/08  Extubated 12/09  Re intubated 12/09  Pneumothorax / R chest tube placed 12/10 Transferred to [facility] 12/13 CT chest>>> R ptx 25-30%, lung tethered to pleural surface in multiple locations, R>L effusion, LLL PNA, stable RLL nodule ; TCTS consulted 12/15 borderline BP 12/16 trach 12/18 - pressors off / on / off  LINES / TUBES: ETT 12/09 >> 12/12, 12/12>>>12/16 12/16 trach (df)>>> R [place] CVL 12/09 >>  R chest tube 12/09 >>   CULTURES: UC 12/23>>> BCX2 12/23>>> Sputum 12/23>>>  ANTIBIOTICS:  INTERVAL HISTORY/  Subjective Poor weaning efforts  VITAL SIGNS: Temp:  [98 F (36.7 C)-100.6 F (38.1 C)] 98.3 F (36.8 C) (12/24 1133) Pulse Rate:  [73-121] 73 (12/24 1133) Resp:  [10-50] 10 (12/24 1133) BP: (82-173)/(48-95) 93/56 mmHg (12/24 1133) SpO2:  [97 %-100 %] 100 % (12/24 1133) FiO2 (%):  [40 %-100 %] 40 % (12/24 1133) HEMODYNAMICS:   VENTILATOR SETTINGS: Vent Mode:  [-] PRVC FiO2 (%):  [40 %-100 %] 40 % Set Rate:  [12 bmp] 12 bmp Vt Set:  [400 mL] 400 mL PEEP:  [5 cmH20] 5 cmH20 Pressure Support:  [14 cmH20] 14 cmH20 Plateau Pressure:  [17 cmH20-21 cmH20] 19 cmH20  INTAKE / OUTPUT: Intake/Output     12/23 0701 - 12/24 0700 12/24 0701 - 12/25 0700   I.V. (mL/kg) 1200 (29.9) 100 (2.5)   IV Piggyback 300    Total Intake(mL/kg) 1500 (37.3) 100 (2.5)   Urine (mL/kg/hr)     Total Output       Net  +1500 +100        Urine Occurrence 4 x 1 x   Stool Occurrence 2 x 1 x     PHYSICAL EXAMINATION: General: agitation Neuro:  follows simple commands HEENT:  mm dry  Cardiovascular: RRR s M Lungs: exp wheeze/rhonchi CT out. CT site unremarkable  Abdomen:  Soft, nontender, +BS, no r/g Ext: no edema  LABS:  Recent Labs Lab 11/05/13 0445 11/08/13 0545 11/09/13 0412  HGB 8.7* 8.7* 7.8*  HCT 27.0* 27.0* 24.0*  WBC 8.3 11.6* 9.2  PLT 264 368 329    Recent Labs Lab 11/04/13 0430 11/05/13 0445 11/06/13 0530 11/09/13 0412  NA 138 137 133* 142  K 3.9 4.3 3.7 2.9*  CL 108 106 99 97  CO2 25 26 27  38*  GLUCOSE 139* 125* 183* 102*  BUN 21 22 28* 19  CREATININE 1.04 0.96 0.93 0.79  CALCIUM 8.1* 8.7 8.7 8.3*   No results found for this basename: AST, ALT, ALKPHOS, BILITOT, PROT, ALBUMIN, INR,  in the last 168 hours  CXR: small loculated right apical PTX vs skin fold. Aeration improved on left    ASSESSMENT / PLAN:  PULMONARY A:   Acute on chronic respiratory failure - failed extubation x 2  AECOPD.  R PTX with persistent air leak, improved 12/16, removed CT 12/22 Looking about same. Failing weaning trials  P: PS wean as tolerated,  needs 18-20 attempts, if fails would push to PS 22 Will plan for LTAC after PEG Would repeat pcxr daily including today pre dc BDers  CARDIOVASCULAR A: Borderline BP, inaccurate cuff?, r/o rel AI Had cortisol 10 on 10th, repeat = 17.8  12/19  P:  Tele Slow taper roids   RENAL A:  Hypokalemia  P:    Replace and recheck KCL today pre dc  GASTROINTESTINAL A:  Severe protein calorie malnutrition  P:   SUP: famotidine  peg  placed  HEMATOLOGIC A:  Anemia without acute blood loss  Recent Labs Lab 11/05/13 0445 11/08/13 0545 11/09/13 0412  HGB 8.7* 8.7* 7.8*    P:  DVT px: sq heparin  INFECTIOUS A: fever 12/22. Cultures sent P:   Follow culture data   ENDOCRINE  A: Hypoglycemia from TF held P:   Monitor glu on chem  panels   NEUROLOGIC A:  Acute encephalopathy History of alcohol abuse P:    Continue respiridol , seems to have improved her organziation  Awaiting transport   Mcarthur Rossetti. Tyson Alias, MD, FACP Pgr: [phone] [place] [facility]

## 2013-11-10 NOTE — Progress Notes (Signed)
Subjective: Pt without acute changes  Objective: Vital signs in last 24 hours: Temp:  [98 F (36.7 C)-100.6 F (38.1 C)] 99.1 F (37.3 C) (12/24 0813) Pulse Rate:  [76-121] 76 (12/24 1100) Resp:  [10-50] 10 (12/24 1100) BP: (82-173)/(48-95) 82/48 mmHg (12/24 1100) SpO2:  [97 %-100 %] 100 % (12/24 1100) FiO2 (%):  [40 %-100 %] 40 % (12/24 1100) Last BM Date: 11/07/13  Intake/Output from previous day: 12/23 0701 - 12/24 0700 In: 1500 [I.V.:1200; IV Piggyback:300] Out: -  Intake/Output this shift: Total I/O In: 100 [I.V.:100] Out: -   G tube intact, mildly tender to palpation, insertion site ok, abd soft  Lab Results:   Recent Labs  11/08/13 0545 11/09/13 0412  WBC 11.6* 9.2  HGB 8.7* 7.8*  HCT 27.0* 24.0*  PLT 368 329   BMET  Recent Labs  11/09/13 0412  NA 142  K 2.9*  CL 97  CO2 38*  GLUCOSE 102*  BUN 19  CREATININE 0.79  CALCIUM 8.3*   PT/INR No results found for this basename: LABPROT, INR,  in the last 72 hours ABG No results found for this basename: PHART, PCO2, PO2, HCO3,  in the last 72 hours  Studies/Results: Ir Gastrostomy Tube Mod Sed  11/09/2013   CLINICAL DATA:  Respiratory failure, ventilatory support, malnutrition  EXAM: 20 FRENCH PULL-THROUGH GASTROSTOMY WITH FLUOROSCOPIC GUIDANCE  Date:  12/23/201412/23/2014 2:38 PM  Radiologist:  Judie Petit. Ruel Favors, MD  Guidance:  FLUOROSCOPIC  MEDICATIONS AND MEDICAL HISTORY: 1 g and Sethadministered within 1 hour of the procedure,1 mg Versed, 25 mcg fentanyl  ANESTHESIA/SEDATION: 10 min  CONTRAST:  15mL OMNIPAQUE IOHEXOL 300 MG/ML  SOLN  FLUOROSCOPY TIME:  1 MIN  PROCEDURE: Informed consent was obtained from the patient following explanation of the procedure, risks, benefits and alternatives. The patient understands, agrees and consents for the procedure. All questions were addressed. A time out was performed.  Maximal barrier sterile technique utilized including caps, mask, sterile gowns, sterile gloves,  large sterile drape, hand hygiene, and betadine prep.  The left upper quadrant was sterilely prepped and draped. An oral gastric catheter was inserted into the stomach under fluoroscopy. The existing nasogastric feeding tube was removed. Air was injected into the stomach for insufflation and visualization under fluoroscopy. The air distended stomach was confirmed beneath the anterior abdominal wall in the frontal and lateral projections. Under sterile conditions and local anesthesia, a 17 gauge trocar needle was utilized to access the stomach percutaneously beneath the left subcostal margin. Needle position was confirmed within the stomach under biplane fluoroscopy. Contrast injection confirmed position also. A single T tack was deployed for gastropexy. Over an Amplatz guide wire, a 9-French sheath was inserted into the stomach. A snare device was utilized to capture the oral gastric catheter. The snare device was pulled retrograde from the stomach up the esophagus and out the oropharynx. The 20-French pull-through gastrostomy was connected to the snare device and pulled antegrade through the oropharynx down the esophagus into the stomach and then through the percutaneous tract external to the patient. The gastrostomy was assembled externally. Contrast injection confirms position in the stomach. Images were obtained for documentation. The patient tolerated procedure well. No immediate complication.  COMPLICATIONS: No immediate  IMPRESSION: Fluoroscopic insertion of a 20-French "pull-through" gastrostomy.   Electronically Signed   By: Ruel Favors M.D.   On: 11/09/2013 14:44   Dg Chest Port 1 View  11/09/2013   CLINICAL DATA:  Chest tube removal.  EXAM: PORTABLE CHEST -  1 VIEW  COMPARISON:  11/08/2013.  FINDINGS: Interim removal of chest tube on right. Very tiny right apical pneumothorax cannot be excluded. Tracheostomy and central line in stable position. Mild atelectatic changes are noted in in the right  perihilar and left perihilar regions. Small bilateral pleural effusions are noted. COPD. Heart size and pulmonary vascularity normal. No acute osseous abnormality.  IMPRESSION: 1. Interim removal of right chest tube. A very tiny right apical pneumothorax cannot be excluded. 2. Tracheostomy and central line in stable position. 3. Bilateral perihilar subsegmental atelectasis. 4. Small bilateral pleural effusions.   Electronically Signed   By: Maisie Fus  Register   On: 11/09/2013 07:06   Dg Chest Port 1 View  11/08/2013   CLINICAL DATA:  Reassess pneumothorax  EXAM: PORTABLE CHEST - 1 VIEW  COMPARISON:  Chest x-ray of 06 November 2013.  FINDINGS: The tiny right pneumothorax in the apex is not visible currently. The right-sided chest tube is unchanged in position. The left lung is well expanded. There is persistent hazy density at the left lung base with some obscuration of the lateral aspect of the left hemidiaphragm. A tracheostomy tube is in place and appears unchanged. The feeding tube appears to been withdrawn. The right subclavian venous catheter is unchanged with its tip in the region of the mid to distal SVC.  IMPRESSION: The pneumothorax on the right is not evident currently. No acute change since yesterday's study is demonstrated otherwise.   Electronically Signed   By: David  [place]   On: 11/08/2013 14:07    Anti-infectives: Anti-infectives   Start     Dose/Rate Route Frequency Ordered Stop   11/09/13 1045  ceFAZolin (ANCEF) IVPB 2 g/50 mL premix     2 g 100 mL/hr over 30 Minutes Intravenous  Once 11/09/13 1045 11/09/13 1325   11/08/13 0000  ceFAZolin (ANCEF) IVPB 2 g/50 mL premix    Comments:  ON CALL TO IR FOR GASTRIC TUBE 12/22   2 g 100 mL/hr over 30 Minutes Intravenous  Once 11/05/13 1443 11/08/13 0100   11/05/13 0600  ceFAZolin (ANCEF) IVPB 2 g/50 mL premix  Status:  Discontinued    Comments:  ON CALL TO IR FOR GASTRIC TUBE ON 12/19   2 g 100 mL/hr over 30 Minutes Intravenous  Once  11/04/13 1445 11/05/13 1443      Assessment/Plan: s/p perc G tube 12/23; ok to use tube; other plans as per CCM  LOS: 14 days    Rotha Cassels,D [facility] 11/10/2013

## 2013-11-11 LAB — CULTURE, RESPIRATORY W GRAM STAIN

## 2013-11-11 LAB — GLUCOSE, CAPILLARY: Glucose-Capillary: 171 mg/dL — ABNORMAL HIGH (ref 70–99)

## 2013-11-11 LAB — PROCALCITONIN: Procalcitonin: 0.59 ng/mL

## 2013-11-11 MED ORDER — POTASSIUM CHLORIDE 20 MEQ PO PACK
20.0000 meq | PACK | Freq: Once | ORAL | Status: DC
  Filled 2013-11-11: qty 1

## 2013-11-11 MED ORDER — POTASSIUM CHLORIDE CRYS ER 20 MEQ PO TBCR
20.0000 meq | EXTENDED_RELEASE_TABLET | Freq: Once | ORAL | Status: AC
  Administered 2013-11-11: 20 meq via ORAL
  Filled 2013-11-11 (×2): qty 1

## 2013-11-11 NOTE — Progress Notes (Signed)
ADMISSION DATE:  10/27/2013 CONSULTATION DATE:  10/27/2013  REFERRING MD :  Maryruth Bun ICU PRIMARY SERVICE:  PCCM  CHIEF COMPLAINT:  Acute respiratory failure  BRIEF PATIENT DESCRIPTION: 67 yo with past medical history of COPD and CHF transferred from [facility] after being admitted x 2 weeks for acute respiratory failure.  Extubated 12/08.  Re intubated 12/09.  Course was complicated by right  pneumothorax requiring chest tube placement.  SIGNIFICANT EVENTS / STUDIES:  11/30  Admitted to [facility] with acute respiratory failure, failed BiPAP, intubated 12/08  Extubated 12/09  Re intubated 12/09  Pneumothorax / R chest tube placed 12/10 Transferred to [facility] 12/13 CT chest>>> R ptx 25-30%, lung tethered to pleural surface in multiple locations, R>L effusion, LLL PNA, stable RLL nodule ; TCTS consulted 12/15 borderline BP 12/16 trach 12/18 - pressors off / on / off  LINES / TUBES: ETT 12/09 >> 12/12, 12/12>>>12/16 12/16 trach (df)>>> R [place] CVL 12/09 >>  R chest tube 12/09 >>   CULTURES: UC 12/23>>> BCX2 12/23>>> Sputum 12/23>>>  ANTIBIOTICS:  INTERVAL HISTORY/  Subjective Poor weaning efforts. Pt shakes head to deny discomfort. Nurse denies concerns.  VITAL SIGNS: Temp:  [98.1 F (36.7 C)-100.3 F (37.9 C)] 98.1 F (36.7 C) (12/25 1114) Pulse Rate:  [70-97] 75 (12/25 1138) Resp:  [11-25] 14 (12/25 1138) BP: (121-161)/(60-83) 121/60 mmHg (12/25 1138) SpO2:  [100 %] 100 % (12/25 1138) FiO2 (%):  [40 %] 40 % (12/25 1138) HEMODYNAMICS:   VENTILATOR SETTINGS: Vent Mode:  [-] PSV;CPAP FiO2 (%):  [40 %] 40 % Set Rate:  [12 bmp] 12 bmp Vt Set:  [400 mL] 400 mL PEEP:  [5 cmH20] 5 cmH20 Pressure Support:  [12 cmH20] 12 cmH20  INTAKE / OUTPUT: Intake/Output     12/24 0701 - 12/25 0700 12/25 0701 - 12/26 0700   I.V. (mL/kg) 1150 (28.6) 312.5 (7.8)   Other 120    IV Piggyback     Total Intake(mL/kg) 1270 (31.6) 312.5 (7.8)   Urine (mL/kg/hr) 3 (0) 1 (0)   Total Output 3 1   Net +1267 +311.5        Urine Occurrence 2 x 2 x   Stool Occurrence 1 x      PHYSICAL EXAMINATION: General: agitation at times. Self protective mittens.  Neuro:  follows simple commands HEENT:  mm dry  Cardiovascular: RRR s M Lungs: Distant, exp wheeze/rhonchi CT out. CT site unremarkable Abdomen:  Soft, nontender, +BS, no r/g Ext: no edema  LABS:  Recent Labs Lab 11/05/13 0445 11/08/13 0545 11/09/13 0412  HGB 8.7* 8.7* 7.8*  HCT 27.0* 27.0* 24.0*  WBC 8.3 11.6* 9.2  PLT 264 368 329    Recent Labs Lab 11/05/13 0445 11/06/13 0530 11/09/13 0412 11/10/13 1230  NA 137 133* 142 137  K 4.3 3.7 2.9* 3.1*  CL 106 99 97 93*  CO2 26 27 38* 37*  GLUCOSE 125* 183* 102* 112*  BUN 22 28* 19 18  CREATININE 0.96 0.93 0.79 0.76  CALCIUM 8.7 8.7 8.3* 8.4  MG  --   --   --  1.3*  PHOS  --   --   --  2.0*   No results found for this basename: AST, ALT, ALKPHOS, BILITOT, PROT, ALBUMIN, INR,  in the last 168 hours  CXR: small loculated right apical PTX vs skin fold. Aeration improved on left    ASSESSMENT / PLAN:  PULMONARY A:   Acute on chronic respiratory failure -  failed extubation x 2  AECOPD.  R PTX with persistent air leak, improved 12/16, removed CT 12/22 Looking about same. Failing weaning trials  Awaiting insurance approval for transfer to [facility] P: PS wean as tolerated, needs 18-20 attempts, if fails would push to PS 22 Will plan for LTAC after PEG Would repeat pcxr daily including today pre dc BDers  CARDIOVASCULAR A: Borderline BP, inaccurate cuff?, r/o rel AI Had cortisol 10 on 10th, repeat = 17.8  12/19  K 3.1 P:  Tele Slow taper roids   RENAL A:  Hypokalemia  P:    Replace and recheck KCL  pre dc  GASTROINTESTINAL A:  Severe protein calorie malnutrition  P:   SUP: famotidine  peg  placed  HEMATOLOGIC A:  Anemia without acute blood loss  Recent Labs Lab 11/05/13 0445 11/08/13 0545 11/09/13 0412  HGB 8.7* 8.7*  7.8*    P:  DVT px: sq heparin  INFECTIOUS A: fever 12/22. Cultures sent P:   Follow culture data   ENDOCRINE  A: Hypoglycemia from TF held P:   Monitor glu on chem panels   NEUROLOGIC A:  Acute encephalopathy History of alcohol abuse P:    Continue respiridol , seems to have improved her organziation  Awaiting transport   CD Young, MD   [facility] Pulmonary & Critical Care p [phone]   m [phone]

## 2013-11-12 ENCOUNTER — Inpatient Hospital Stay (HOSPITAL_COMMUNITY): Payer: Medicare Other

## 2013-11-12 DIAGNOSIS — J9620 Acute and chronic respiratory failure, unspecified whether with hypoxia or hypercapnia: Secondary | ICD-10-CM

## 2013-11-12 DIAGNOSIS — Z930 Tracheostomy status: Secondary | ICD-10-CM

## 2013-11-12 LAB — BASIC METABOLIC PANEL
CO2: 39 mEq/L — ABNORMAL HIGH (ref 19–32)
Calcium: 8.3 mg/dL — ABNORMAL LOW (ref 8.4–10.5)
Chloride: 96 mEq/L (ref 96–112)
Potassium: 2.8 mEq/L — ABNORMAL LOW (ref 3.5–5.1)
Sodium: 142 mEq/L (ref 135–145)

## 2013-11-12 MED ORDER — POTASSIUM CHLORIDE 20 MEQ/15ML (10%) PO LIQD
40.0000 meq | ORAL | Status: AC
  Administered 2013-11-12 – 2013-11-13 (×3): 40 meq
  Filled 2013-11-12 (×3): qty 30

## 2013-11-12 MED ORDER — HYDROCORTISONE SOD SUCCINATE 100 MG IJ SOLR
25.0000 mg | Freq: Two times a day (BID) | INTRAMUSCULAR | Status: DC
  Administered 2013-11-13 – 2013-11-15 (×7): 25 mg via INTRAVENOUS
  Filled 2013-11-12 (×9): qty 0.5

## 2013-11-12 MED ORDER — BUDESONIDE 0.25 MG/2ML IN SUSP
0.5000 mg | Freq: Two times a day (BID) | RESPIRATORY_TRACT | Status: DC
  Administered 2013-11-12: 0.5 mg via RESPIRATORY_TRACT
  Filled 2013-11-12 (×4): qty 4

## 2013-11-12 NOTE — Progress Notes (Signed)
ADMISSION DATE:  10/27/2013 CONSULTATION DATE:  10/27/2013  REFERRING MD :  Maryruth Bun ICU PRIMARY SERVICE:  PCCM  CHIEF COMPLAINT:  Acute respiratory failure  BRIEF PATIENT DESCRIPTION: 67 yo with past medical history of COPD and CHF transferred from [facility] after being admitted x 2 weeks for acute respiratory failure.  Extubated 12/08.  Re intubated 12/09.  Course was complicated by right  pneumothorax requiring chest tube placement.  SIGNIFICANT EVENTS / STUDIES:  11/30  Admitted to [facility] with acute respiratory failure, failed BiPAP, intubated 12/08  Extubated 12/09  Re intubated 12/09  Pneumothorax / R chest tube placed 12/10 Transferred to [facility] 12/13 CT chest>>> R ptx 25-30%, lung tethered to pleural surface in multiple locations, R>L effusion, LLL PNA, stable RLL nodule ; TCTS consulted 12/15 borderline BP 12/16 trach 12/18 - pressors off / on / off  LINES / TUBES: ETT 12/09 >> 12/12, 12/12>>>12/16, 12/16 trach (df)>>> R [place] CVL 12/09 >>  R chest tube 12/09 >>   CULTURES: UC 12/23>>> BCX2 12/23>>> Sputum 12/23>>>  ANTIBIOTICS: None on 11/12/13    INTERVAL HISTORY/ Subjective Poor weaning efforts. Pt shakes head to deny discomfort. Nurse denies concerns.  VITAL SIGNS: Temp:  [97.2 F (36.2 C)-98.5 F (36.9 C)] 97.2 F (36.2 C) (12/26 1148) Pulse Rate:  [78-110] 79 (12/26 1234) Resp:  [10-41] 20 (12/26 1234) BP: (102-166)/(67-87) 159/80 mmHg (12/26 1234) SpO2:  [99 %-100 %] 100 % (12/26 1234) FiO2 (%):  [40 %] 40 % (12/26 1148) HEMODYNAMICS:   VENTILATOR SETTINGS: Vent Mode:  [-] PRVC FiO2 (%):  [40 %] 40 % Set Rate:  [12 bmp] 12 bmp Vt Set:  [400 mL] 400 mL PEEP:  [5 cmH20] 5 cmH20 Pressure Support:  [12 cmH20] 12 cmH20 Plateau Pressure:  [18 cmH20-33 cmH20] 33 cmH20  INTAKE / OUTPUT: Intake/Output     12/25 0701 - 12/26 0700 12/26 0701 - 12/27 0700   I.V. (mL/kg) 312.5 (7.8) 662.5 (16.5)   Other     NG/GT 498.8 350   Total  Intake(mL/kg) 811.3 (20.2) 1012.5 (25.2)   Urine (mL/kg/hr) 5 (0) 1 (0)   Stool 1 (0) 1 (0)   Total Output 6 2   Net +805.3 +1010.5        Urine Occurrence 2 x      PHYSICAL EXAMINATION: General: agitation at times. Self protective mittens.  Neuro:  follows simple commands HEENT:  mm dry  Cardiovascular: RRR s M Lungs: Distant, exp wheeze/rhonchi  Abdomen:  Soft, nontender, +BS, no r/g Ext: no edema  LABS: PULMONARY No results found for this basename: PHART, PCO2, PCO2ART, PO2, PO2ART, HCO3, TCO2, O2SAT,  in the last 168 hours  CBC  Recent Labs Lab 11/08/13 0545 11/09/13 0412  HGB 8.7* 7.8*  HCT 27.0* 24.0*  WBC 11.6* 9.2  PLT 368 329    COAGULATION No results found for this basename: INR,  in the last 168 hours  CARDIAC  No results found for this basename: TROPONINI,  in the last 168 hours No results found for this basename: PROBNP,  in the last 168 hours   CHEMISTRY  Recent Labs Lab 11/06/13 0530 11/09/13 0412 11/10/13 1230 11/12/13 0550  NA 133* 142 137 142  K 3.7 2.9* 3.1* 2.8*  CL 99 97 93* 96  CO2 27 38* 37* 39*  GLUCOSE 183* 102* 112* 130*  BUN 28* 19 18 20   CREATININE 0.93 0.79 0.76 0.72  CALCIUM 8.7 8.3* 8.4 8.3*  MG  --   --  1.3*  --   PHOS  --   --  2.0*  --    Estimated Creatinine Clearance: 42.7 ml/min (by C-G formula based on Cr of 0.72).   LIVER No results found for this basename: AST, ALT, ALKPHOS, BILITOT, PROT, ALBUMIN, INR,  in the last 168 hours   INFECTIOUS  Recent Labs Lab 11/09/13 0902 11/10/13 0430 11/11/13 0358  PROCALCITON 0.32 0.80 0.59     ENDOCRINE CBG (last 3)   Recent Labs  11/11/13 2003  GLUCAP 171*         IMAGING x48h  Dg Chest Port 1 View  11/12/2013   CLINICAL DATA:  Pneumothorax, history of COPD  EXAM: PORTABLE CHEST - 1 VIEW  COMPARISON:  11/10/2013; 11/08/2013; 11/05/2013; 10/30/2013; chest CT -10/30/2013  FINDINGS: Grossly unchanged cardiac silhouette and mediastinal contours.  Stable positioning of support apparatus. The lines remain hyperexpanded with flattening of the bilateral hemidiaphragms. Slight worsening of right mid lung linear heterogeneous opacities favored to represent atelectasis. Persistent biapical pleural parenchymal thickening. No definite pneumothorax. No definite evidence of edema. Unchanged trace bilateral pleural effusions. Unchanged bones.  IMPRESSION: 1. Stable positioning of support apparatus. No definite pneumothorax. 2. Marked lung hyperexpansion with slight worsening of right mid lung atelectasis. 3. Unchanged trace bilateral pleural effusions. No definite evidence of edema.   Electronically Signed   By: Simonne Come M.D.   On: 11/12/2013 07:55       ASSESSMENT / PLAN:     Acute on chronic respiratory failure - failed extubation x 2  AECOPD.  R PTX with persistent air leak, improved 12/16, removed CT 12/22 Looking about same. Failing weaning trials  Awaiting insurance approval for transfer to [facility]  P: Wean as tolerated.  PRN CXR BDers   Borderline BP, inaccurate cuff?, r/o rel AI Had cortisol 10 on 10th, repeat = 17.8  12/19  K 3.1 P:  Tele Slow taper roids   Hypokalemia  P:    Replace and recheck KCL  pre dc Check mag and phos  Severe protein calorie malnutrition P:   SUP: famotidine  peg  placed  Anemia without acute blood loss  Recent Labs Lab 11/08/13 0545 11/09/13 0412  HGB 8.7* 7.8*    P:  DVT px: sq heparin PRBC for hgb < /= 6.9gm% only unless actively bleeding  fever 12/22. Cultures sent P:   Follow culture data   Hypoglycemia from TF held P:   Monitor glu on chem panels   Acute encephalopathy History of alcohol abuse P:    Continue respiridol , seems to have improved her organziation  GLOBAL 12/26: awaiting tx to [facility]  Note by Anders Simmonds NP @ 12.51pm   STaffed by   Dr. Kalman Shan, M.D., [facility].C.P Pulmonary and Critical Care Medicine Staff Physician [place]  [facility] [place] [facility] Pager: [phone], If no answer or between  15:00h - 7:00h: call [phone]  11/12/2013 2:54 PM

## 2013-11-13 ENCOUNTER — Inpatient Hospital Stay (HOSPITAL_COMMUNITY): Payer: Medicare Other

## 2013-11-13 LAB — COMPREHENSIVE METABOLIC PANEL
ALT: 7 U/L (ref 0–35)
AST: 12 U/L (ref 0–37)
Albumin: 2.3 g/dL — ABNORMAL LOW (ref 3.5–5.2)
Alkaline Phosphatase: 62 U/L (ref 39–117)
CO2: 42 mEq/L (ref 19–32)
Calcium: 8.1 mg/dL — ABNORMAL LOW (ref 8.4–10.5)
Chloride: 97 mEq/L (ref 96–112)
GFR calc non Af Amer: >90 mL/min (ref >90)
Potassium: 4.1 mEq/L (ref 3.5–5.1)
Sodium: 140 mEq/L (ref 135–145)
Total Bilirubin: 0.3 mg/dL (ref 0.3–1.2)

## 2013-11-13 LAB — BLOOD GAS, ARTERIAL
MECHVT: 400 mL
PEEP: 5 cmH2O
Patient temperature: 98.6
pH, Arterial: 7.459 — ABNORMAL HIGH (ref 7.350–7.450)
pO2, Arterial: 135 mmHg — ABNORMAL HIGH (ref 80.0–100.0)

## 2013-11-13 LAB — CBC WITH DIFFERENTIAL/PLATELET
Basophils Absolute: 0 10*3/uL (ref 0.0–0.1)
Basophils Relative: 0 % (ref 0–1)
Eosinophils Absolute: 0 10*3/uL (ref 0.0–0.7)
Hemoglobin: 8.1 g/dL — ABNORMAL LOW (ref 12.0–15.0)
Lymphocytes Relative: 8 % — ABNORMAL LOW (ref 12–46)
MCH: 27.6 pg (ref 26.0–34.0)
MCHC: 31.9 g/dL (ref 30.0–36.0)
Monocytes Absolute: 0.3 10*3/uL (ref 0.1–1.0)
Monocytes Relative: 3 % (ref 3–12)
Neutrophils Relative %: 89 % — ABNORMAL HIGH (ref 43–77)
Platelets: 300 10*3/uL (ref 150–400)
RDW: 16.9 % — ABNORMAL HIGH (ref 11.5–15.5)
WBC: 11.3 10*3/uL — ABNORMAL HIGH (ref 4.0–10.5)

## 2013-11-13 LAB — PRO B NATRIURETIC PEPTIDE: Pro B Natriuretic peptide (BNP): 925.9 pg/mL — ABNORMAL HIGH (ref 0–125)

## 2013-11-13 LAB — PHOSPHORUS: Phosphorus: 2.4 mg/dL (ref 2.3–4.6)

## 2013-11-13 LAB — MAGNESIUM: Magnesium: 1.4 mg/dL — ABNORMAL LOW (ref 1.5–2.5)

## 2013-11-13 MED ORDER — WHITE PETROLATUM GEL
Status: AC
  Administered 2013-11-13: 1
  Filled 2013-11-13: qty 5

## 2013-11-13 MED ORDER — BUDESONIDE 0.5 MG/2ML IN SUSP
0.5000 mg | Freq: Two times a day (BID) | RESPIRATORY_TRACT | Status: DC
  Administered 2013-11-13 – 2013-11-16 (×7): 0.5 mg via RESPIRATORY_TRACT
  Filled 2013-11-13 (×9): qty 2

## 2013-11-13 NOTE — Progress Notes (Addendum)
Upon initial assessment with day oncoming nurse; pt is lethargic, unable to follow commands.  Vital signs are documented in Epic.  MD contacted, MD requested STAT blood gas.

## 2013-11-13 NOTE — Progress Notes (Signed)
STAT ABG drawn by respiratory.  Nurse assessment remains lethargic, unable to follow commands, pt lower extremities are cool to touch.  Nurse will continue to monitor

## 2013-11-13 NOTE — Progress Notes (Addendum)
Nurse reassessment of pt; pt alert using accessory muscles to breath, RR 30-50's, pt denies pain.  Vital signs at this time are documented in Epic.  Nurse text paged MD with latest assessment data and ABG results.  Nurse will continue to monitor

## 2013-11-13 NOTE — Progress Notes (Signed)
ADMISSION DATE:  10/27/2013 CONSULTATION DATE:  10/27/2013  REFERRING MD :  Maryruth Bun ICU PRIMARY SERVICE:  PCCM  CHIEF COMPLAINT:  Acute respiratory failure  BRIEF PATIENT DESCRIPTION: 67 yo with past medical history of COPD and CHF transferred from [facility] after being admitted x 2 weeks for acute respiratory failure.  Extubated 12/08.  Re intubated 12/09.  Course was complicated by right  pneumothorax requiring chest tube placement.  SIGNIFICANT EVENTS / STUDIES:  11/30  Admitted to [facility] with acute respiratory failure, failed BiPAP, intubated 12/08  Extubated 12/09  Re intubated 12/09  Pneumothorax / R chest tube placed 12/10 Transferred to [facility] 12/13 CT chest>>> R ptx 25-30%, lung tethered to pleural surface in multiple locations, R>L effusion, LLL PNA, stable RLL nodule ; TCTS consulted 12/15 borderline BP 12/16 trach 12/18 - pressors off / on / off ~12/22 chest tube out 12/27 increased SOB w/ ABG 7.46/ 55/ 135, CXR=> pending  LINES / TUBES: ETT 12/09 >> 12/12, 12/12>>>12/16, 12/16 trach (df)>>> R [place] CVL 12/09 >>  R chest tube 12/09 >>   CULTURES: UC 12/23>>> BCX2 12/23>>> Sputum 12/23>>>  ANTIBIOTICS: None on 11/12/13    INTERVAL HISTORY/ Subjective Poor weaning efforts. Pt shakes head to deny discomfort. Nurse denies concerns.  VITAL SIGNS: Temp:  [97.2 F (36.2 C)-98.5 F (36.9 C)] 97.5 F (36.4 C) (12/27 0759) Pulse Rate:  [50-118] 105 (12/27 0837) Resp:  [5-41] 35 (12/27 0837) BP: (82-159)/(52-82) 143/75 mmHg (12/27 0837) SpO2:  [99 %-100 %] 100 % (12/27 0930) FiO2 (%):  [40 %] 40 % (12/27 0930) HEMODYNAMICS:   VENTILATOR SETTINGS: Vent Mode:  [-] PRVC FiO2 (%):  [40 %] 40 % Set Rate:  [12 bmp] 12 bmp Vt Set:  [400 mL] 400 mL PEEP:  [5 cmH20] 5 cmH20 Plateau Pressure:  [15 cmH20-29 cmH20] 29 cmH20  INTAKE / OUTPUT: Intake/Output     12/26 0701 - 12/27 0700 12/27 0701 - 12/28 0700   I.V. (mL/kg) 662.5 (16.5) 50 (1.2)   NG/GT  1135 35   Total Intake(mL/kg) 1797.5 (44.7) 85 (2.1)   Urine (mL/kg/hr) 4 (0)    Stool 2 (0)    Total Output 6     Net +1791.5 +85        Urine Occurrence 2 x    Stool Occurrence 3 x      PHYSICAL EXAMINATION: General: agitation at times. Self protective mittens.  Neuro:  follows simple commands HEENT:  mm dry  Cardiovascular: RRR s M Lungs: Distant, exp wheeze/rhonchi  Abdomen:  Soft, nontender, +BS, no r/g Ext: no edema  LABS: PULMONARY  Recent Labs Lab 11/13/13 0747  PHART 7.459*  PCO2ART 55.2*  PO2ART 135.0*  HCO3 38.6*  TCO2 40.3  O2SAT 100.0   CBC  Recent Labs Lab 11/08/13 0545 11/09/13 0412 11/13/13 0440  HGB 8.7* 7.8* 8.1*  HCT 27.0* 24.0* 25.4*  WBC 11.6* 9.2 11.3*  PLT 368 329 300   COAGULATION No results found for this basename: INR,  in the last 168 hours  CARDIAC  No results found for this basename: TROPONINI,  in the last 168 hours  Recent Labs Lab 11/13/13 0440  PROBNP 925.9*   CHEMISTRY  Recent Labs Lab 11/09/13 0412 11/10/13 1230 11/12/13 0550 11/13/13 0440  NA 142 137 142 140  K 2.9* 3.1* 2.8* 4.1  CL 97 93* 96 97  CO2 38* 37* 39* 42*  GLUCOSE 102* 112* 130* 126*  BUN 19 18 20  19  CREATININE 0.79 0.76 0.72 0.57  CALCIUM 8.3* 8.4 8.3* 8.1*  MG  --  1.3*  --  1.4*  PHOS  --  2.0*  --  2.4   Estimated Creatinine Clearance: 42.7 ml/min (by C-G formula based on Cr of 0.57).  LIVER  Recent Labs Lab 11/13/13 0440  AST 12  ALT 7  ALKPHOS 62  BILITOT 0.3  PROT 4.7*  ALBUMIN 2.3*   INFECTIOUS  Recent Labs Lab 11/09/13 0902 11/10/13 0430 11/11/13 0358  PROCALCITON 0.32 0.80 0.59   ENDOCRINE CBG (last 3)   Recent Labs  11/11/13 2003  GLUCAP 171*    IMAGING x48h  Dg Chest Port 1 View  11/12/2013   CLINICAL DATA:  Pneumothorax, history of COPD  EXAM: PORTABLE CHEST - 1 VIEW  COMPARISON:  11/10/2013; 11/08/2013; 11/05/2013; 10/30/2013; chest CT -10/30/2013  FINDINGS: Grossly unchanged cardiac silhouette  and mediastinal contours. Stable positioning of support apparatus. The lines remain hyperexpanded with flattening of the bilateral hemidiaphragms. Slight worsening of right mid lung linear heterogeneous opacities favored to represent atelectasis. Persistent biapical pleural parenchymal thickening. No definite pneumothorax. No definite evidence of edema. Unchanged trace bilateral pleural effusions. Unchanged bones.  IMPRESSION: 1. Stable positioning of support apparatus. No definite pneumothorax. 2. Marked lung hyperexpansion with slight worsening of right mid lung atelectasis. 3. Unchanged trace bilateral pleural effusions. No definite evidence of edema.   Electronically Signed   By: Simonne Come M.D.   On: 11/12/2013 07:55    CXR 12/27 w/ incr SOB=> pending    ASSESSMENT / PLAN:    Acute on chronic respiratory failure - failed extubation x 2  AECOPD.  R PTX with persistent air leak, improved 12/16, removed CT 12/22 Looking about same. Failing weaning trials  Awaiting insurance approval for transfer to [facility] P: Wean as tolerated.  F/u CXR 12/27 pending BDers   Borderline BP, inaccurate cuff?, r/o rel AI Had cortisol 10 on 10th, repeat = 17.8  12/19  K 3.1 P:  Tele Slow taper roids   Hypokalemia  P:    Replace and recheck KCL  pre dc Check mag and phos  Severe protein calorie malnutrition P:   SUP: famotidine  peg  placed  Anemia without acute blood loss  Recent Labs Lab 11/08/13 0545 11/09/13 0412 11/13/13 0440  HGB 8.7* 7.8* 8.1*    P:  DVT px: sq heparin PRBC for hgb < /= 6.9gm% only unless actively bleeding  fever 12/22. Cultures sent P:   Follow culture data   Hypoglycemia from TF held P:   Monitor glu on chem panels   Acute encephalopathy History of alcohol abuse P:    Continue respiridol , seems to have improved her organziation  GLOBAL 12/26: awaiting tx to [facility]   Lonzo Cloud. Kriste Basque MD  [facility] 11/13/2013 10:15 AM

## 2013-11-14 LAB — CBC WITH DIFFERENTIAL/PLATELET
Basophils Absolute: 0 10*3/uL (ref 0.0–0.1)
Basophils Relative: 0 % (ref 0–1)
Eosinophils Relative: 0 % (ref 0–5)
MCHC: 31.4 g/dL (ref 30.0–36.0)
MCV: 87.7 fL (ref 78.0–100.0)
Monocytes Absolute: 0.4 10*3/uL (ref 0.1–1.0)
Monocytes Relative: 3 % (ref 3–12)
Neutro Abs: 11.7 10*3/uL — ABNORMAL HIGH (ref 1.7–7.7)
Neutrophils Relative %: 91 % — ABNORMAL HIGH (ref 43–77)
RDW: 17.3 % — ABNORMAL HIGH (ref 11.5–15.5)

## 2013-11-14 LAB — BASIC METABOLIC PANEL
BUN: 17 mg/dL (ref 6–23)
GFR calc Af Amer: >90 mL/min (ref >90)
GFR calc non Af Amer: >90 mL/min (ref >90)
Potassium: 3.7 mEq/L (ref 3.5–5.1)
Sodium: 139 mEq/L (ref 135–145)

## 2013-11-14 LAB — PHOSPHORUS: Phosphorus: 2.4 mg/dL (ref 2.3–4.6)

## 2013-11-14 LAB — MAGNESIUM: Magnesium: 1.2 mg/dL — ABNORMAL LOW (ref 1.5–2.5)

## 2013-11-14 NOTE — Progress Notes (Signed)
PULMONARY / CRITICAL CARE MEDICINE  Name: Brandy [name] MRN: [id number] DOB: 24-Apr-1946    ADMISSION DATE:  10/27/2013 CONSULTATION DATE:  10/27/2013  REFERRING MD :  EDP PRIMARY SERVICE:  PCCM  CHIEF COMPLAINT:  Acute respiratory failure  BRIEF PATIENT DESCRIPTION: 67 yo with COPD and CHF transferred from [facility] after being admitted x 2 weeks for acute respiratory failure. Extubated 12/08. Re intubated 12/09. Course was complicated by right pneumothorax requiring chest tube placement.  SIGNIFICANT EVENTS / STUDIES:  11/30   Admitted to [facility] with acute respiratory failure, failed BiPAP, intubated  12/08   Extubated  12/09   Re intubated  12/09   Pneumothorax / R chest tube placed  12/10  Transferred to [facility]  12/13   CT chest>>> R ptx 25-30%, lung tethered to pleural surface in multiple locations, R>L effusion,       LLL PNA, stable RLL nodule ; TCTS consulted   12/16 Trach 12/22  Chest tube removed  LINES / TUBES: ETT 12/09 >> 12/12, 12/12>>>12/16 Trach 12/16 (DF) >>>  R [place] CVL 12/09 >>  R chest tube 12/09 >>>???  CULTURES: Urine 12/23 >>> yeast Blood 12/23 >>> neg Sputum 12/23>>> neg  ANTIBIOTICS:  INTERVAL HISTORY:  No overnight issues.  VITAL SIGNS: Temp:  [97.2 F (36.2 C)-98.3 F (36.8 C)] 97.2 F (36.2 C) (12/29 1114) Pulse Rate:  [60-116] 60 (12/29 1200) Resp:  [12-46] 12 (12/29 1200) BP: (84-205)/(49-115) 84/50 mmHg (12/29 1200) SpO2:  [99 %-100 %] 100 % (12/29 1200) FiO2 (%):  [40 %] 40 % (12/29 1114) HEMODYNAMICS:   VENTILATOR SETTINGS: Vent Mode:  [-] PRVC FiO2 (%):  [40 %] 40 % Set Rate:  [12 bmp] 12 bmp Vt Set:  [400 mL] 400 mL PEEP:  [5 cmH20] 5 cmH20 Pressure Support:  [10 cmH20] 10 cmH20 Plateau Pressure:  [14 cmH20-20 cmH20] 14 cmH20 INTAKE / OUTPUT: Intake/Output     12/28 0701 - 12/29 0700 12/29 0701 - 12/30 0700   I.V. (mL/kg) 1050 (26.1) 350 (8.7)   NG/GT 735 245   Total Intake(mL/kg) 1785 (44.4) 595 (14.8)   Urine  (mL/kg/hr)     Stool     Total Output       Net +1785 +595        Urine Occurrence 4 x 5 x   Stool Occurrence 2 x 1 x     PHYSICAL EXAMINATION: General:  Chronically ill looking Neuro:  Follows commnds HEENT:  PERRL, dry membranes Cardiovascular:  regular Lungs:  Bilateral diminished air entry, rhonchi / wheezes Abdomen:  Soft, nontender, bowel sounds diminished Musculoskeletal:  No edema Skin:  No rash  LABS: CBC  Recent Labs Lab 11/13/13 0440 11/14/13 0541 11/15/13 0500  WBC 11.3* 12.8* 11.9*  HGB 8.1* 8.7* 8.9*  HCT 25.4* 27.7* 28.3*  PLT 300 314 343   Coag's No results found for this basename: APTT, INR,  in the last 168 hours BMET  Recent Labs Lab 11/13/13 0440 11/14/13 0541 11/15/13 0500  NA 140 139 140  K 4.1 3.7 3.3*  CL 97 96 95*  CO2 42* 39* 39*  BUN 19 17 13   CREATININE 0.57 0.48* 0.51  GLUCOSE 126* 161* 109*   Electrolytes  Recent Labs Lab 11/13/13 0440 11/14/13 0541 11/15/13 0500  CALCIUM 8.1* 8.0* 8.4  MG 1.4* 1.2* 1.2*  PHOS 2.4 2.4 2.4   Sepsis Markers  Recent Labs Lab 11/09/13 0902 11/10/13 0430 11/11/13 0358  PROCALCITON 0.32 0.80 0.59  ABG  Recent Labs Lab 11/13/13 0747  PHART 7.459*  PCO2ART 55.2*  PO2ART 135.0*   Liver Enzymes  Recent Labs Lab 11/13/13 0440  AST 12  ALT 7  ALKPHOS 62  BILITOT 0.3  ALBUMIN 2.3*   Cardiac Enzymes  Recent Labs Lab 11/13/13 0440  PROBNP 925.9*   Glucose  Recent Labs Lab 11/11/13 2003  GLUCAP 171*   CXR:  None today  ASSESSMENT / PLAN:  PULMONARY A:  Acute on chronic respiratory failure. S/p tracheostomy. Ventilator dependent. R pneumothorax with persistent air leak - resolved. P:   Goal SpO2>92 Trach collar as tolerated Mechanical support PRN Trend ABG / CXR Albuterol / Brovana / Pulmicort  CARDIOVASCULAR A: Hypotension. P:  Goal SBP>100  RENAL A:  Hypokalemia. Hypomagnesemia. P:   Trend BMP K 40 x 2 Mg 2 x 1  GASTROINTESTINAL A:  Severe  protein calorie malnutrition. Dysphagia. S/p PEG. P:   TF Pepcid for GI Px  HEMATOLOGIC A:  Chronic anemia. P:  Trend CBC Heparin for DVT Px  INFECTIOUS A:  No overt infection. P:   No intervention required  ENDOCRINE  A:  Presumed adrenal insufficiency. P:   SSI Solu - Cortef 25 q12h  NEUROLOGIC A:  Acute encephalopathy.  History of alcohol abuse. P:   Fentanyl PRN Versed PRN Risperdal bid Clonazepam bid  Requiring chronic ventilator facility. Awaiting insurance authorization.  I have personally obtained history, examined patient, evaluated and interpreted laboratory and imaging results, reviewed medical records, formulated assessment / plan and placed orders.  Lonia Farber, MD Pulmonary and Critical Care Medicine [facility] Pager: [phone]  11/15/2013, 3:50 PM

## 2013-11-14 NOTE — Progress Notes (Signed)
ADMISSION DATE:  10/27/2013 CONSULTATION DATE:  10/27/2013  REFERRING MD :  Maryruth Bun ICU PRIMARY SERVICE:  PCCM  CHIEF COMPLAINT:  Acute respiratory failure  BRIEF PATIENT DESCRIPTION: 67 yo with past medical history of COPD and CHF transferred from [facility] after being admitted x 2 weeks for acute respiratory failure.  Extubated 12/08.  Re intubated 12/09.  Course was complicated by right  pneumothorax requiring chest tube placement.  SIGNIFICANT EVENTS / STUDIES:  11/30  Admitted to [facility] with acute respiratory failure, failed BiPAP, intubated 12/08  Extubated 12/09  Re intubated 12/09  Pneumothorax / R chest tube placed 12/10 Transferred to [facility] 12/13 CT chest>>> R ptx 25-30%, lung tethered to pleural surface in multiple locations, R>L effusion, LLL PNA, stable RLL nodule ; TCTS consulted 12/15 borderline BP 12/16 trach 12/18 - pressors off / on / off ~12/22 chest tube out 12/27 increased SOB w/ ABG 7.46/ 55/ 135, CXR=> pending  LINES / TUBES: ETT 12/09 >> 12/12, 12/12>>>12/16, 12/16 trach (df)>>> R [place] CVL 12/09 >>  R chest tube 12/09 >>   CULTURES: UC 12/23>>> BCX2 12/23>>> Sputum 12/23>>>  ANTIBIOTICS: None on 11/12/13   INTERVAL HISTORY/ Subjective Poor weaning efforts. Pt shakes head to deny discomfort. Nurse denies concerns.  VITAL SIGNS: Temp:  [97.5 F (36.4 C)-98.4 F (36.9 C)] 97.8 F (36.6 C) (12/28 0411) Pulse Rate:  [64-109] 64 (12/28 0600) Resp:  [12-49] 21 (12/28 0600) BP: (95-159)/(51-86) 95/51 mmHg (12/28 0600) SpO2:  [98 %-100 %] 100 % (12/28 0906) FiO2 (%):  [40 %] 40 % (12/28 0906) HEMODYNAMICS:   VENTILATOR SETTINGS: Vent Mode:  [-] PRVC FiO2 (%):  [40 %] 40 % Set Rate:  [12 bmp] 12 bmp Vt Set:  [400 mL] 400 mL PEEP:  [5 cmH20] 5 cmH20 Plateau Pressure:  [20 cmH20-23 cmH20] 23 cmH20  INTAKE / OUTPUT: Intake/Output     12/27 0701 - 12/28 0700 12/28 0701 - 12/29 0700   I.V. (mL/kg) 350 (8.7)    NG/GT 175    Total  Intake(mL/kg) 525 (13.1)    Urine (mL/kg/hr) 2 (0)    Stool 2 (0)    Total Output 4     Net +521            PHYSICAL EXAMINATION: General: agitation at times. Self protective mittens.  Neuro:  follows simple commands HEENT:  mm dry  Cardiovascular: RRR s M Lungs: Distant, exp wheeze/rhonchi  Abdomen:  Soft, nontender, +BS, no r/g Ext: no edema  LABS: PULMONARY  Recent Labs Lab 11/13/13 0747  PHART 7.459*  PCO2ART 55.2*  PO2ART 135.0*  HCO3 38.6*  TCO2 40.3  O2SAT 100.0   CBC  Recent Labs Lab 11/09/13 0412 11/13/13 0440 11/14/13 0541  HGB 7.8* 8.1* 8.7*  HCT 24.0* 25.4* 27.7*  WBC 9.2 11.3* 12.8*  PLT 329 300 314   COAGULATION No results found for this basename: INR,  in the last 168 hours  CARDIAC  No results found for this basename: TROPONINI,  in the last 168 hours  Recent Labs Lab 11/13/13 0440  PROBNP 925.9*   CHEMISTRY  Recent Labs Lab 11/09/13 0412 11/10/13 1230 11/12/13 0550 11/13/13 0440 11/14/13 0541  NA 142 137 142 140 139  K 2.9* 3.1* 2.8* 4.1 3.7  CL 97 93* 96 97 96  CO2 38* 37* 39* 42* 39*  GLUCOSE 102* 112* 130* 126* 161*  BUN 19 18 20 19 17   CREATININE 0.79 0.76 0.72 0.57 0.48*  CALCIUM  8.3* 8.4 8.3* 8.1* 8.0*  MG  --  1.3*  --  1.4* 1.2*  PHOS  --  2.0*  --  2.4 2.4   Estimated Creatinine Clearance: 42.7 ml/min (by C-G formula based on Cr of 0.48).  LIVER  Recent Labs Lab 11/13/13 0440  AST 12  ALT 7  ALKPHOS 62  BILITOT 0.3  PROT 4.7*  ALBUMIN 2.3*   INFECTIOUS  Recent Labs Lab 11/09/13 0902 11/10/13 0430 11/11/13 0358  PROCALCITON 0.32 0.80 0.59   ENDOCRINE CBG (last 3)   Recent Labs  11/11/13 2003  GLUCAP 171*    IMAGING x48h  Dg Chest Port 1 View  11/13/2013   CLINICAL DATA:  Increase shortness of breath question recurrent pneumothorax, history COPD, CHF  EXAM: PORTABLE CHEST - 1 VIEW  COMPARISON:  Portable exam 1304 hr compared to 11/12/2013  FINDINGS: Tracheostomy tube and left  subclavian line stable.  Normal heart size, mediastinal contours and pulmonary vascularity.  Atherosclerotic calcification aorta.  Slight rotation to the left.  Persistent atelectasis versus consolidation in left lower lobe increased since previous exam.  Small bibasilar effusions.  Subsegmental atelectasis at right base.  Underlying COPD changes.  Upper lungs clear without definite pneumothorax.  Bones demineralized.  IMPRESSION: Bibasilar effusions and right basilar atelectasis.  Atelectasis versus consolidation left lower lobe increased since previous exam.  Underlying COPD without definite pneumothorax.   Electronically Signed   By: Ulyses Southward M.D.   On: 11/13/2013 15:44     ASSESSMENT / PLAN:  Acute on chronic respiratory failure - failed extubation x 2  AECOPD.  R PTX with persistent air leak, improved 12/16, removed CT 12/22 Looking about same. Failing weaning trials  Awaiting insurance approval for transfer to [facility] P: Wean as tolerated.  F/u CXR 12/27 => bibasilar atx/ effus/ opac w/o pneumoth seen BDers  Borderline BP, inaccurate cuff?, r/o rel AI Had cortisol 10 on 10th, repeat = 17.8  12/19  K 3.1 P:  Tele Slow taper roids   Hypokalemia  P:    Replace and recheck KCL  pre dc Check mag and phos  Severe protein calorie malnutrition P:   SUP: famotidine  peg  placed  Anemia without acute blood loss  Recent Labs Lab 11/09/13 0412 11/13/13 0440 11/14/13 0541  HGB 7.8* 8.1* 8.7*    P:  DVT px: sq heparin PRBC for hgb < /= 6.9gm% only unless actively bleeding  fever 12/22. Cultures sent P:   Follow culture data   Hypoglycemia from TF held P:   Monitor glu on chem panels   Acute encephalopathy History of alcohol abuse P:    Continue respiridol , seems to have improved her organziation  GLOBAL 12/26: awaiting tx to [facility]   Lonzo Cloud. Kriste Basque MD  [facility] 11/14/2013 10:49 AM

## 2013-11-15 LAB — CULTURE, BLOOD (ROUTINE X 2): Culture: NO GROWTH

## 2013-11-15 LAB — CBC WITH DIFFERENTIAL/PLATELET
Basophils Absolute: 0 10*3/uL (ref 0.0–0.1)
Basophils Relative: 0 % (ref 0–1)
Eosinophils Absolute: 0.1 10*3/uL (ref 0.0–0.7)
Eosinophils Relative: 1 % (ref 0–5)
Hemoglobin: 8.9 g/dL — ABNORMAL LOW (ref 12.0–15.0)
Lymphs Abs: 0.7 10*3/uL (ref 0.7–4.0)
MCH: 27.3 pg (ref 26.0–34.0)
Monocytes Relative: 9 % (ref 3–12)
Neutrophils Relative %: 84 % — ABNORMAL HIGH (ref 43–77)
Platelets: 343 10*3/uL (ref 150–400)
RBC: 3.26 MIL/uL — ABNORMAL LOW (ref 3.87–5.11)
RDW: 17.2 % — ABNORMAL HIGH (ref 11.5–15.5)

## 2013-11-15 LAB — BASIC METABOLIC PANEL
BUN: 13 mg/dL (ref 6–23)
CO2: 39 mEq/L — ABNORMAL HIGH (ref 19–32)
Calcium: 8.4 mg/dL (ref 8.4–10.5)
Creatinine, Ser: 0.51 mg/dL (ref 0.50–1.10)
GFR calc Af Amer: >90 mL/min (ref >90)
GFR calc non Af Amer: >90 mL/min (ref >90)
Glucose, Bld: 109 mg/dL — ABNORMAL HIGH (ref 70–99)
Sodium: 140 mEq/L (ref 135–145)

## 2013-11-15 LAB — MAGNESIUM: Magnesium: 1.2 mg/dL — ABNORMAL LOW (ref 1.5–2.5)

## 2013-11-15 LAB — PHOSPHORUS: Phosphorus: 2.4 mg/dL (ref 2.3–4.6)

## 2013-11-15 MED ORDER — POTASSIUM CHLORIDE 20 MEQ/15ML (10%) PO LIQD
40.0000 meq | ORAL | Status: AC
  Administered 2013-11-15 (×2): 40 meq
  Filled 2013-11-15 (×2): qty 30

## 2013-11-15 MED ORDER — MAGNESIUM SULFATE 40 MG/ML IJ SOLN
2.0000 g | Freq: Once | INTRAMUSCULAR | Status: AC
  Administered 2013-11-15: 2 g via INTRAVENOUS
  Filled 2013-11-15: qty 50

## 2013-11-15 NOTE — Progress Notes (Addendum)
NUTRITION FOLLOW UP  DOCUMENTATION CODES  Per approved criteria   -Severe malnutrition in the context of chronic illness    Intervention:   Continue current EN regimen RD to follow for nutrition care plan  Nutrition Dx:   Inadequate oral intake related to inability to eat as evidenced by NPO status, ongoing  Goal:   EN to meet > 90% of estimated nutrition needs, met  Monitor:   EN regimen & tolerance, respiratory status, weight, labs, I/O's  Assessment:   67 yo with past medical history of COPD and CHF transferred from [facility] on 12/10 after being admitted x 2 weeks for acute respiratory failure. Intubated on 11/30, extubated on 12/08. Re intubated 12/09. Course was complicated by pneumothorax requiring chest tube placement.  Patient s/p procedure 12/18: PERC TRACHEOSTOMY   Patient transferred to 2C-Stepdown from 41M-Medical ICU 12/19.  Patient is currently on ventilator support  MV: 6.1 L/min Temp (24hrs), Avg:97.7 F (36.5 C), Min:97.2 F (36.2 C), Max:98.3 F (36.8 C)   Patient s/p G-tube placement per IR 12/23.  Vital AF 1.2 formula currently infusing at 35 ml/hr via G-tube providing 1008 kcals, 63 gm protein, 681 ml free water.  Tolerating well.  Disposition: vent SNF.  Height: Ht Readings from Last 1 Encounters:  10/27/13 5\' 1"  (1.549 m)    Weight Status ---> stable Wt Readings from Last 1 Encounters:  11/09/13 88 lb 10 oz (40.2 kg)    12/22 88 lb 12/21 88 lb 12/13 88 lb 12/12 88 lb 12/11 84 lb 12/10 92 lb  Re-estimated needs:  Kcal: [id number] Protein: 55-65 gm  Fluid: >/= 1.5 L  Skin: Intact  Diet Order: NPO   Intake/Output Summary (Last 24 hours) at 11/15/13 1559 Last data filed at 11/15/13 1100  Gross per 24 hour  Intake   1700 ml  Output      0 ml  Net   1700 ml    Labs:   Recent Labs Lab 11/13/13 0440 11/14/13 0541 11/15/13 0500  NA 140 139 140  K 4.1 3.7 3.3*  CL 97 96 95*  CO2 42* 39* 39*  BUN 19 17 13    CREATININE 0.57 0.48* 0.51  CALCIUM 8.1* 8.0* 8.4  MG 1.4* 1.2* 1.2*  PHOS 2.4 2.4 2.4  GLUCOSE 126* 161* 109*    Scheduled Meds: . antiseptic oral rinse  1 application Mouth Rinse QID  . arformoterol  15 mcg Nebulization BID  . budesonide (PULMICORT) nebulizer solution  0.5 mg Nebulization BID  . chlorhexidine  15 mL Mouth/Throat BID  . clonazePAM  0.5 mg Oral BID  . famotidine  20 mg Per Tube Daily  . feeding supplement (VITAL AF 1.2 CAL)  1,000 mL Per Tube Q24H  . heparin subcutaneous  5,000 Units Subcutaneous Q8H  . hydrocortisone sodium succinate  25 mg Intravenous Q12H  . magnesium sulfate 1 - 4 g bolus IVPB  2 g Intravenous Once  . potassium chloride  40 mEq Per Tube Q4H  . risperiDONE  2 mg Oral BID    Continuous Infusions:    Maureen Chatters, RD, LDN Pager #: [phone] After-Hours Pager #: [phone]

## 2013-11-15 NOTE — Progress Notes (Signed)
CSW called Brandy [name] with [facility] vent SNF admissions. Brandy [name] still has not heard back from [facility] but is hoping to hear from them today with insurance auth. As soon as authorization is granted, CSW will facilitate discharge to facility.   Maryclare Labrador, MSW, [facility] Clinical Social Worker [phone]

## 2013-11-16 LAB — BASIC METABOLIC PANEL
BUN: 14 mg/dL (ref 6–23)
CO2: 39 mEq/L — ABNORMAL HIGH (ref 19–32)
Calcium: 8.7 mg/dL (ref 8.4–10.5)
Chloride: 98 mEq/L (ref 96–112)
Creatinine, Ser: 0.5 mg/dL (ref 0.50–1.10)
Glucose, Bld: 125 mg/dL — ABNORMAL HIGH (ref 70–99)
Sodium: 141 mEq/L (ref 137–147)

## 2013-11-16 LAB — CBC WITH DIFFERENTIAL/PLATELET
Eosinophils Relative: 1 % (ref 0–5)
HCT: 25.4 % — ABNORMAL LOW (ref 36.0–46.0)
Lymphocytes Relative: 3 % — ABNORMAL LOW (ref 12–46)
Lymphs Abs: 0.5 10*3/uL — ABNORMAL LOW (ref 0.7–4.0)
MCV: 86.1 fL (ref 78.0–100.0)
Platelets: 272 10*3/uL (ref 150–400)
RBC: 2.95 MIL/uL — ABNORMAL LOW (ref 3.87–5.11)
WBC: 16.1 10*3/uL — ABNORMAL HIGH (ref 4.0–10.5)

## 2013-11-16 LAB — MAGNESIUM: Magnesium: 1.7 mg/dL (ref 1.5–2.5)

## 2013-11-16 LAB — PHOSPHORUS: Phosphorus: 2.2 mg/dL — ABNORMAL LOW (ref 2.3–4.6)

## 2013-11-16 MED ORDER — CLONAZEPAM 0.5 MG PO TABS: 0.5000 mg | ORAL_TABLET | Freq: Two times a day (BID) | ORAL | Status: AC

## 2013-11-16 MED ORDER — ALBUTEROL SULFATE (2.5 MG/3ML) 0.083% IN NEBU: 2.5000 mg | INHALATION_SOLUTION | RESPIRATORY_TRACT | Status: DC | PRN

## 2013-11-16 MED ORDER — HYDROCODONE-ACETAMINOPHEN 7.5-325 MG/15ML PO SOLN: 15.0000 mL | Freq: Four times a day (QID) | ORAL | Status: AC | PRN

## 2013-11-16 MED ORDER — BUDESONIDE 0.5 MG/2ML IN SUSP: 0.5000 mg | Freq: Two times a day (BID) | RESPIRATORY_TRACT | Status: AC

## 2013-11-16 NOTE — Progress Notes (Addendum)
[facility] vent SNF needs discharge summary addended with today's date to be able to accept pt today. CSW paged MD and MD is aware of this.  Addendum: Discharge packet completed and on pt's chart. RN informed. CSW has faxed updated discharge summary to [facility]. CSW signing off.  Maryclare Labrador, MSW, [facility] Clinical Social Worker [phone]

## 2013-11-16 NOTE — Progress Notes (Signed)
Late entry  Clinical Social Work Department CLINICAL SOCIAL WORK PLACEMENT NOTE 11/16/2013  Patient:  ARLENE, BRICKEL  Account Number:  [id number] Admit date:  10/27/2013  Clinical Social Worker:  Maryclare Labrador, Theresia Majors  Date/time:  11/16/2013 01:35 PM  Clinical Social Work is seeking post-discharge placement for this patient at the following level of care:   SKILLED NURSING   (*CSW will update this form in Epic as items are completed)   N/A-vent patient, clinicals faxed to vent SNFs Patient/family provided with [facility] Department of Clinical Social Work's list of facilities offering this level of care within the geographic area requested by the patient (or if unable, by the patient's family).  11/09/13  Patient/family informed of their freedom to choose among providers that offer the needed level of care, that participate in Medicare, Medicaid or managed care program needed by the patient, have an available bed and are willing to accept the patient.  N/A-clinicals not sent to this facility  Patient/family informed of [facility]' ownership interest in [facility], as well as of the fact that they are under no obligation to receive care at this facility.  PASARR submitted to EDS on 11/09/2013 PASARR number received from [employer] on 11/09/2013  FL2 transmitted to all facilities in geographic area requested by pt/family on  11/09/2013 FL2 transmitted to all facilities within larger geographic area on   Patient informed that his/her managed care company has contracts with or will negotiate with  certain facilities, including the following:     Patient/family informed of bed offers received:  11/16/13 Patient chooses bed at [facility] vent SNF Physician recommends and patient chooses bed at    Patient to be transferred to [facility] vent SNF on 11/16/13  Patient to be transferred to facility by [facility]  The following physician request were entered in Epic:   Additional  Comments:   Maryclare Labrador, MSW, [facility] Clinical Social Worker [phone]

## 2013-11-16 NOTE — Discharge Summary (Signed)
Physician Discharge Summary       Patient ID: Brandy [name] MRN: [id number] DOB/AGE: [birth year] 68 y.o.  Admit date: 10/27/2013 Discharge date: 11/16/2013  Active Problems:  Acute respiratory failure  Pneumothorax  Bronchopleural fistula  COPD (chronic obstructive pulmonary disease)  Encephalopathy acute  Protein-calorie malnutrition, severe  Obstructive chronic bronchitis with exacerbation   Detailed Hospital Course:  67 yo with past medical history of COPD and CHF transferred from [facility] after being admitted x 2 weeks for acute respiratory failure. Extubated 12/08. Re intubated 12/09. Course was complicated by pneumothorax requiring chest tube placement. Transferred to [facility] for further care. Hospital course high-lights are as follows: 12/10 Transferred to [facility] 12/13 CT chest>>> R ptx 25-30%, lung tethered to pleural surface in multiple locations, R>L effusion, LLL PNA, stable RLL nodule ; TCTS consulted: recommended increased suction to CT to 30 cm H2O. The pneumothorax eventually resolved, there was no airleak noted in drainage system and the patient was monitored to ensure CXR was stable. Had decreased blood pressure requiring pressors on 12/15. She underwent trach on 12/16 for failure to wean. 12/18 - pressors off / on / off. Chest tube was removed on 12/22. As of 12/23 she has a stable CXR. There may be a small apical right PTX but this is unchanged from prior films even when CT was in place. Had PEG tube placed on 12/23 by interventional radiology. As of 12/23 she was cleared for d/c to [facility] setting with the following directions as listed below per active dx. This transfer was delayed until 12/30 due to insurance provider approval, medically has been stable and awaiting transfer since 12/23.   Discharge Plan by diagnoses  1) Acute on chronic respiratory failure due to AECOPD. Complicated by - failed extubation x 2, and right pneumothorax at outside hospital  2)  Tracheostomy status  Recommendation  -initiate weaning protocol per vent/SNF -routine trach care    Borderline BP  Relative adrenal insufficient  Had cortisol 10 on 10th. Has since been weaned off steroids Recommendation  SBP goal > 100  Severe protein calorie malnutrition  Dysphagia s/p PEG 12/23  Recommendations  -tube feeds per RD eval  -PEG site care   Chronic Anemia without acute blood loss  Recommendations  Cbc in am  Continue prophylactic dose heparin   Acute encephalopathy  History of alcohol abuse  Recommendations  Continue rispirdal, may need increase   Acute encephalopathy. History of alcohol abuse.  Recommendation  Risperdal bid  Clonazepam bid PRN Analgesia via PEG   Significant Hospital tests/ studies/ interventions and procedures  Consults  Thoracic surgery Low threshold for abx  11/30 Admitted to [facility] with acute respiratory failure, failed BiPAP, intubated  12/08 Extubated  12/09 Re intubated  12/09 Pneumothorax / R chest tube placed  12/10 Transferred to [facility]  12/13 CT chest>>> R ptx 25-30%, lung tethered to pleural surface in multiple locations, R>L effusion, LLL PNA, stable RLL nodule ; TCTS consulted  12/15 borderline BP  12/16 trach  12/18 - pressors off / on / off  12/22: spiked fever. CXR was better. Pan cultured, but procalcitonin is negative  12/23 PEG placed   LINES / TUBES:  ETT 12/09 >> 12/12, 12/12>>>12/16  12/16 trach (df)>>>  R [place] CVL 12/09 >>  R chest tube 12/09 >> 12/22  PEG tube placed 12/23 by IR   CULTURES:  UC 12/23>>> neg  BCX2 12/23>>> neg Sputum 12/23>>> neg   ANTIBIOTICS:  Discharge Exam: BP 165/100  Pulse  112  Temp(Src) 97.8 F (36.6 C) (Axillary)  Resp 15  Ht 5\' 1"  (1.549 m)  Wt 40.2 kg (88 lb 10 oz)  BMI 16.75 kg/m2  SpO2 99%  General: Chronically ill looking  Neuro: Follows commnds  HEENT: PERRL, dry membranes  Cardiovascular: regular  Lungs: Bilateral diminished air entry, rhonchi / wheezes    Abdomen: Soft, nontender, bowel sounds diminished  Musculoskeletal: No edema  Skin: No rash  Labs at discharge Lab Results  Component Value Date   CREATININE 0.50 11/16/2013   BUN 14 11/16/2013   NA 141 11/16/2013   K 4.8 11/16/2013   CL 98 11/16/2013   CO2 39* 11/16/2013   Lab Results  Component Value Date   WBC 16.1* 11/16/2013   HGB 8.1* 11/16/2013   HCT 25.4* 11/16/2013   MCV 86.1 11/16/2013   PLT 272 11/16/2013   Lab Results  Component Value Date   ALT 7 11/13/2013   AST 12 11/13/2013   ALKPHOS 62 11/13/2013   BILITOT 0.3 11/13/2013   Lab Results  Component Value Date   INR 1.05 11/02/2013   INR 1.26 10/27/2013    Current radiology studies No results found.  Disposition:        Discharge Orders   Future Orders Complete By Expires   Diet - low sodium heart healthy  As directed    Discharge instructions  As directed    Comments:     Continue current vent settings Vent Mode:  [-] PRVC FiO2 (%):  [40 %] 40 % Set Rate:  [12 bmp-15 bmp] 12 bmp Vt Set:  [400 mL] 400 mL PEEP:  [5 cmH20-10 cmH20] 5 cmH20 Plateau Pressure:  [16 cmH20-24 cmH20] 22 cmH20   Increase activity slowly  As directed    Scheduling Instructions:     Vent Mode:  [-] PRVC FiO2 (%):  [40 %-100 %] 100 % Set Rate:  [12 bmp] 12 bmp Vt Set:  [400 mL] 400 mL PEEP:  [5 cmH20] 5 cmH20  Activity: Needs PT and OT consult  Diet NPO except meds today 12/23 Ok to resume tube feeds 12/23   Increase activity slowly  As directed        Medication List    STOP taking these medications       budesonide-formoterol 80-4.5 MCG/ACT inhaler  Commonly known as:  SYMBICORT     COMBIVENT RESPIMAT 20-100 MCG/ACT Aers respimat  Generic drug:  Ipratropium-Albuterol     lisinopril-hydrochlorothiazide 20-12.5 MG per tablet  Commonly known as:  PRINZIDE,ZESTORETIC     lovastatin 20 MG tablet  Commonly known as:  MEVACOR      TAKE these medications       albuterol (5 MG/ML) 0.5% nebulizer  solution  Commonly known as:  PROVENTIL  Take 0.5 mLs (2.5 mg total) by nebulization every 2 (two) hours as needed for wheezing or shortness of breath.     antiseptic oral rinse Liqd  15 mLs by Mouth Rinse route QID.     arformoterol 15 MCG/2ML Nebu  Commonly known as:  BROVANA  Take 2 mLs (15 mcg total) by nebulization 2 (two) times daily.     budesonide 0.5 MG/2ML nebulizer solution  Commonly known as:  PULMICORT  Take 2 mLs (0.5 mg total) by nebulization 2 (two) times daily.     chlorhexidine 0.12 % solution  Commonly known as:  PERIDEX  Use as directed 15 mLs in the mouth or throat 2 (two) times daily.  clonazePAM 0.5 MG tablet  Commonly known as:  KLONOPIN  Place 1 tablet (0.5 mg total) into feeding tube 2 (two) times daily.     dextrose 5 % solution  50 ml/hr     famotidine 40 MG/5ML suspension  Commonly known as:  PEPCID  Place 2.5 mLs (20 mg total) into feeding tube daily.     feeding supplement (VITAL AF 1.2 CAL) Liqd  Place 1,000 mLs into feeding tube daily.     heparin 5000 UNIT/ML injection  Inject 1 mL (5,000 Units total) into the skin every 8 (eight) hours.     HYDROcodone-acetaminophen 7.5-325 mg/15 ml solution  Commonly known as:  HYCET  Take 15 mLs by mouth 4 (four) times daily as needed for moderate pain.     potassium chloride 20 MEQ/15ML (10%) solution  Place 30 mLs (40 mEq total) into feeding tube daily.     risperiDONE 1 MG/ML oral solution  Commonly known as:  RISPERDAL  Take 2 mLs (2 mg total) by mouth 2 (two) times daily.        Discharged Condition: fair  Physician Statement:   The Patient was personally examined, the discharge assessment and plan has been personally reviewed and I agree with ACNP Babcock's assessment and plan. > 30 minutes of time have been dedicated to discharge assessment, planning and discharge instructions.   SignedAnders Simmonds 11/16/2013, 11:45 AM  Lonia Farber, MD Pulmonary and Critical  Care Medicine [facility] Pager: [phone]

## 2013-12-17 ENCOUNTER — Encounter (HOSPITAL_COMMUNITY): Payer: Self-pay | Admitting: Emergency Medicine

## 2013-12-17 ENCOUNTER — Emergency Department (HOSPITAL_COMMUNITY)
Admission: EM | Admit: 2013-12-17 | Discharge: 2013-12-19 | Disposition: E | Payer: Medicare Other | Attending: Emergency Medicine | Admitting: Emergency Medicine

## 2013-12-17 DIAGNOSIS — I509 Heart failure, unspecified: Secondary | ICD-10-CM | POA: Insufficient documentation

## 2013-12-17 DIAGNOSIS — I469 Cardiac arrest, cause unspecified: Secondary | ICD-10-CM

## 2013-12-17 DIAGNOSIS — Z930 Tracheostomy status: Secondary | ICD-10-CM | POA: Insufficient documentation

## 2013-12-17 DIAGNOSIS — Z87891 Personal history of nicotine dependence: Secondary | ICD-10-CM | POA: Insufficient documentation

## 2013-12-17 DIAGNOSIS — J449 Chronic obstructive pulmonary disease, unspecified: Secondary | ICD-10-CM | POA: Insufficient documentation

## 2013-12-17 DIAGNOSIS — J4489 Other specified chronic obstructive pulmonary disease: Secondary | ICD-10-CM | POA: Insufficient documentation

## 2013-12-17 DIAGNOSIS — Z79899 Other long term (current) drug therapy: Secondary | ICD-10-CM | POA: Insufficient documentation

## 2013-12-17 DIAGNOSIS — A419 Sepsis, unspecified organism: Secondary | ICD-10-CM

## 2013-12-17 MED ORDER — SODIUM BICARBONATE 8.4 % IV SOLN
INTRAVENOUS | Status: AC | PRN
  Administered 2013-12-17: 50 meq via INTRAVENOUS

## 2013-12-17 MED ORDER — EPINEPHRINE HCL 0.1 MG/ML IJ SOSY
PREFILLED_SYRINGE | INTRAMUSCULAR | Status: AC | PRN
  Administered 2013-12-17 (×3): 1 mg via INTRAVENOUS

## 2013-12-17 MED ORDER — CALCIUM CHLORIDE 10 % IV SOLN
INTRAVENOUS | Status: AC | PRN
  Administered 2013-12-17: 1 g via INTRAVENOUS

## 2013-12-17 MED FILL — Medication: Qty: 1 | Status: AC

## 2013-12-19 NOTE — ED Notes (Signed)
Family at bedside and RN gave emotional support to family.

## 2013-12-19 NOTE — Progress Notes (Signed)
Chaplain was paged to ed for a cpr pt.  Upon arrival doctor was notifying the family that the pt was dying.  Family accompanied doctor into the trauma bay and asked doctor to stop chest compressions.  Pt died shortly thereafter and family came back to visit with pt.  Chaplain provided communication sharing, emotional and grief support as well as prayer.  Family seemed grateful for the presence.

## 2013-12-19 NOTE — Code Documentation (Signed)
CPR D/c'd Per Family.

## 2013-12-19 NOTE — Code Documentation (Addendum)
No Pulse. CPR Continues. 

## 2013-12-19 NOTE — ED Notes (Signed)
Patient from [facility]. Patient sent for respiratory distress. Hx of CHF and Pneumonia. BP 60/40, Sats in min 80's. Patient not alert or responsive.

## 2013-12-19 NOTE — Code Documentation (Signed)
Family at beside. Family given emotional support. 

## 2013-12-19 NOTE — ED Provider Notes (Signed)
CSN: [id number]     Arrival date & time 01-13-2014  0020 History   First MD Initiated Contact with Patient 01-13-2014 0047     Chief Complaint  Patient presents with  . Respiratory Distress   (Consider location/radiation/quality/duration/timing/severity/associated sxs/prior Treatment) HPI Comments: 68 year old female with history of COPD and congestive heart failure who has a very complicated recent medical history, admitted to an outside hospital wher required intubation and central line placement which the family states she suffered an iatrogenic pneumothorax. This seemed to heal up, she was transferred to this hospital and had a tracheostomy placed secondary to failure to wean from the vent.  She was [facility] for [facility], she was sent over here this evening because of increased work of breathing, hypoxia and altered mental status. The patient is unable to talk, she is obtunded on arrival  The history is provided by the EMS personnel, the nursing home, medical records and a relative.    Past Medical History  Diagnosis Date  . COPD (chronic obstructive pulmonary disease)   . CHF (congestive heart failure)    Past Surgical History  Procedure Laterality Date  . Tracheostomy      feinstein   No family history on file. History  Substance Use Topics  . Smoking status: Former Games developer  . Smokeless tobacco: Not on file  . Alcohol Use: Not on file   OB History   Grav Para Term Preterm Abortions TAB SAB Ect Mult Living                 Review of Systems  Unable to perform ROS: Patient unresponsive    Allergies  Review of patient's allergies indicates no known allergies.  Home Medications   Current Outpatient Rx  Name  Route  Sig  Dispense  Refill  . albuterol (PROVENTIL) (5 MG/ML) 0.5% nebulizer solution   Nebulization   Take 0.5 mLs (2.5 mg total) by nebulization every 2 (two) hours as needed for wheezing or shortness of breath.   20 mL   12   .  antiseptic oral rinse (BIOTENE) LIQD   Mouth Rinse   15 mLs by Mouth Rinse route QID.         Marland Kitchen arformoterol (BROVANA) 15 MCG/2ML NEBU   Nebulization   Take 2 mLs (15 mcg total) by nebulization 2 (two) times daily.   120 mL      . budesonide (PULMICORT) 0.5 MG/2ML nebulizer solution   Nebulization   Take 2 mLs (0.5 mg total) by nebulization 2 (two) times daily.      12   . chlorhexidine (PERIDEX) 0.12 % solution   Mouth/Throat   Use as directed 15 mLs in the mouth or throat 2 (two) times daily.   120 mL   0   . clonazePAM (KLONOPIN) 0.5 MG tablet   Per Tube   Place 1 tablet (0.5 mg total) into feeding tube 2 (two) times daily.   30 tablet   0   . dextrose 5 % solution      50 ml/hr   50 mL      . famotidine (PEPCID) 40 MG/5ML suspension   Per Tube   Place 2.5 mLs (20 mg total) into feeding tube daily.   50 mL   0   . heparin 5000 UNIT/ML injection   Subcutaneous   Inject 1 mL (5,000 Units total) into the skin every 8 (eight) hours.   1 mL      .  HYDROcodone-acetaminophen (HYCET) 7.5-325 mg/15 ml solution   Oral   Take 15 mLs by mouth 4 (four) times daily as needed for moderate pain.   120 mL   0   . Nutritional Supplements (FEEDING SUPPLEMENT, VITAL AF 1.2 CAL,) LIQD   Per Tube   Place 1,000 mLs into feeding tube daily.           Begin tube feeds as directed in instructions above ...   . potassium chloride 20 MEQ/15ML (10%) solution   Per Tube   Place 30 mLs (40 mEq total) into feeding tube daily.   500 mL   0   . risperiDONE (RISPERDAL) 1 MG/ML oral solution   Oral   Take 2 mLs (2 mg total) by mouth 2 (two) times daily.   30 mL   0    Wt 88 lb (39.917 kg) Physical Exam  Nursing note and vitals reviewed. Constitutional: She appears distressed.  Cachectic  HENT:  Head: Normocephalic and atraumatic.  Mouth/Throat: No oropharyngeal exudate.  Eyes: Conjunctivae and EOM are normal. Pupils are equal, round, and reactive to light. Right eye  exhibits no discharge. Left eye exhibits no discharge. No scleral icterus.  Neck:  Tracheostomy, purulent drainage  Cardiovascular:  Severe bradycardia, no palpable pulses  Pulmonary/Chest:  No spontaneous respirations, ventilator dependent, diffuse rales and rhonchi with ventilator-assisted respirations  Abdominal: Soft. Bowel sounds are normal. She exhibits no distension and no mass. There is no tenderness.  Musculoskeletal: Normal range of motion.  Severe cachectic, muscle wasting, no effusions  Neurological:  Unresponsive, GCS of 3  Skin:  Deep foul-smelling sacral decubitus ulcer with purulent material  Psychiatric: She has a normal mood and affect. Her behavior is normal.    ED Course  Procedures (including critical care time) Labs Review Labs Reviewed - No data to display Imaging Review No results found.  EKG Interpretation   None       MDM   1. Cardiac arrest   2. Sepsis    The patient is severely ill appearing, she went into cardiac arrest shortly after arrival into a rhythm of asystole. CPR was initiated as the patient was a full code. Family arrived shortly thereafter. I directed CPR for approximately 15 minutes during which time I spoke with the family and allow them to see her at the bedside. They asked for resuscitative efforts to be stopped and the patient was declared at 12:43 AM. I discussed the care with the on-call medical examiner, Luanna SalkJackie Perkins who has stated that this is not a medical examiner case and that the patient likely die from complications of her severe medical illnesses. I concurred.  Cardiopulmonary Resuscitation (CPR) Procedure Note Directed/Performed by: Vida RollerMILLER,Darlyn Repsher D I personally directed ancillary staff and/or performed CPR in an effort to regain return of spontaneous circulation and to maintain cardiac, neuro and systemic perfusion.      Vida RollerBrian D Karron Goens, MD 2014/09/16 [phone]

## 2013-12-19 NOTE — Code Documentation (Signed)
No Pulse. CPR Continues.

## 2013-12-19 NOTE — ED Notes (Signed)
Lucile CraterWillie Sellers - [phone] Archie Saulters - [phone] Alphonse GuildLorenza Gautreau - [phone]

## 2013-12-19 NOTE — Code Documentation (Signed)
Patient time of death occurred at [id number]. Family was present with the MD

## 2013-12-19 NOTE — ED Notes (Signed)
Family does not know which funeral home service they are going to use.

## 2013-12-19 DEATH — deceased

## 2013-12-22 ENCOUNTER — Telehealth (HOSPITAL_COMMUNITY): Payer: Self-pay

## 2013-12-22 NOTE — ED Notes (Signed)
[facility] who has death certificate for Dr Brandy SchwartzB. Brandy [name] to sign.  Reviewed Dr Brandy [name]'s EPIC note and doesn't say he will or any other MD will sign.  [facility] list lists him as attending but doesn't indicate with a yes or no that he will sign.  Informed funeral home and said they could bring it over and we will ask and have it signed if he has agreed to sign.  [name]

## 2014-01-30 IMAGING — CR DG CHEST 1V PORT
1 series · 1 of 1 positions shown · non-contrast
Comparison: Portable exam 8283 hr compared to 11/12/2013

CLINICAL DATA: Increase shortness of breath question recurrent
pneumothorax, history COPD, CHF

EXAM:
PORTABLE CHEST - 1 VIEW

[AP]
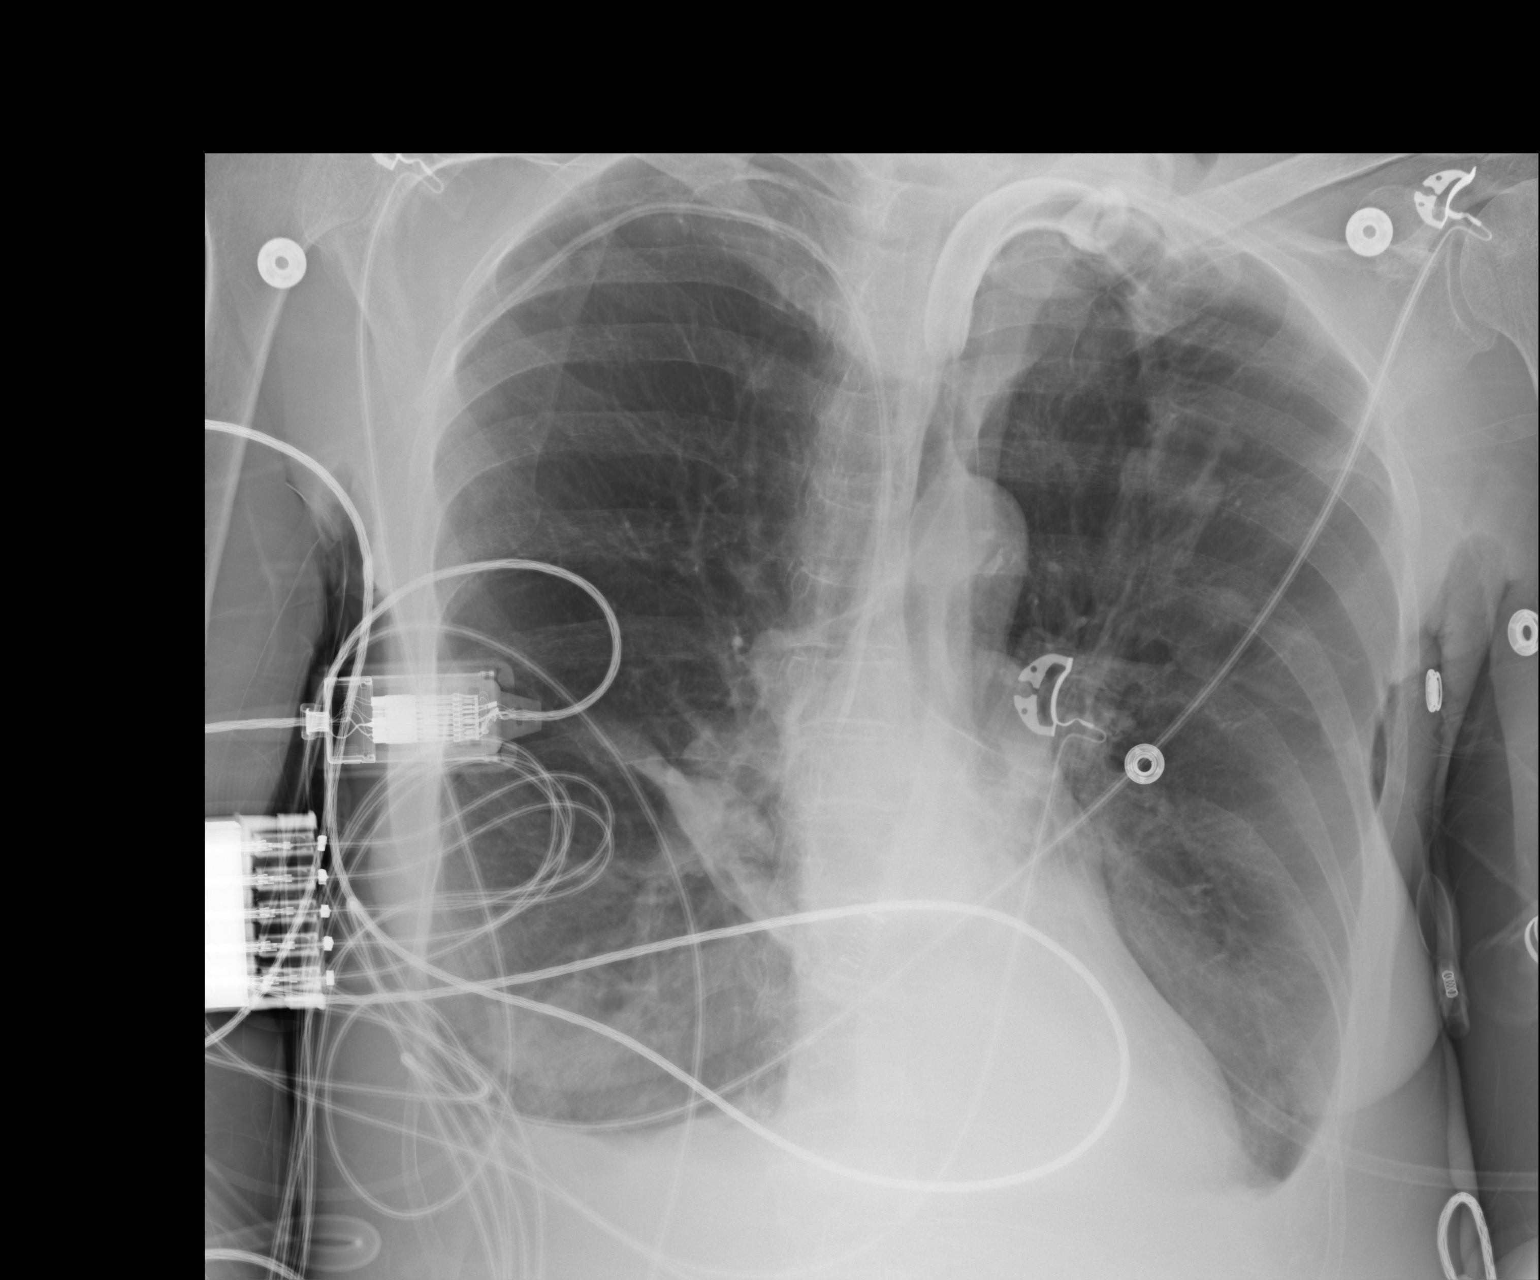

[1 of 1 positions shown; findings below may reference images not displayed]

FINDINGS: Tracheostomy tube and left subclavian line stable.

Normal heart size, mediastinal contours and pulmonary vascularity.

Atherosclerotic calcification aorta.

Slight rotation to the left.

Persistent atelectasis versus consolidation in left lower lobe
increased since previous exam.

Small bibasilar effusions.

Subsegmental atelectasis at right base.

Underlying COPD changes.

Upper lungs clear without definite pneumothorax.

Bones demineralized.
IMPRESSION: Bibasilar effusions and right basilar atelectasis.

Atelectasis versus consolidation left lower lobe increased since
previous exam.

Underlying COPD without definite pneumothorax.
